# Patient Record
Sex: Female | Born: 1955 | Race: White | Hispanic: No | Smoking: Never smoker
Health system: Southern US, Community
[De-identification: ages and names within clinical notes are randomized; demographics above are authoritative.]

## PROBLEM LIST (undated history)

## (undated) DIAGNOSIS — T7840XA Allergy, unspecified, initial encounter: Secondary | ICD-10-CM

## (undated) DIAGNOSIS — J45909 Unspecified asthma, uncomplicated: Secondary | ICD-10-CM

## (undated) DIAGNOSIS — E785 Hyperlipidemia, unspecified: Secondary | ICD-10-CM

## (undated) DIAGNOSIS — M1990 Unspecified osteoarthritis, unspecified site: Secondary | ICD-10-CM

## (undated) HISTORY — PX: POLYPECTOMY: SHX149

## (undated) HISTORY — DX: Unspecified osteoarthritis, unspecified site: M19.90

## (undated) HISTORY — PX: BREAST ENHANCEMENT SURGERY: SHX7

## (undated) HISTORY — PX: COLONOSCOPY: SHX174

## (undated) HISTORY — PX: HAND SURGERY: SHX662

## (undated) HISTORY — DX: Allergy, unspecified, initial encounter: T78.40XA

## (undated) HISTORY — PX: DILATION AND CURETTAGE OF UTERUS: SHX78

## (undated) HISTORY — PX: RETAINED PLACENTA REMOVAL: SHX2336

## (undated) HISTORY — DX: Unspecified asthma, uncomplicated: J45.909

## (undated) HISTORY — DX: Hyperlipidemia, unspecified: E78.5

---

## 1999-10-03 ENCOUNTER — Other Ambulatory Visit: Admission: RE | Admit: 1999-10-03 | Discharge: 1999-10-03 | Payer: Self-pay | Admitting: Obstetrics & Gynecology

## 1999-10-03 ENCOUNTER — Encounter (INDEPENDENT_AMBULATORY_CARE_PROVIDER_SITE_OTHER): Payer: Self-pay

## 2001-01-22 ENCOUNTER — Encounter: Payer: Self-pay | Admitting: Obstetrics & Gynecology

## 2001-01-22 ENCOUNTER — Encounter: Admission: RE | Admit: 2001-01-22 | Discharge: 2001-01-22 | Payer: Self-pay | Admitting: Obstetrics & Gynecology

## 2001-02-10 ENCOUNTER — Other Ambulatory Visit: Admission: RE | Admit: 2001-02-10 | Discharge: 2001-02-10 | Payer: Self-pay | Admitting: *Deleted

## 2001-04-04 ENCOUNTER — Encounter (INDEPENDENT_AMBULATORY_CARE_PROVIDER_SITE_OTHER): Payer: Self-pay

## 2001-04-04 ENCOUNTER — Other Ambulatory Visit: Admission: RE | Admit: 2001-04-04 | Discharge: 2001-04-04 | Payer: Self-pay | Admitting: *Deleted

## 2002-01-29 ENCOUNTER — Encounter: Admission: RE | Admit: 2002-01-29 | Discharge: 2002-01-29 | Payer: Self-pay | Admitting: Family Medicine

## 2002-04-22 ENCOUNTER — Encounter: Payer: Self-pay | Admitting: Family Medicine

## 2002-04-22 ENCOUNTER — Encounter: Admission: RE | Admit: 2002-04-22 | Discharge: 2002-04-22 | Payer: Self-pay | Admitting: Family Medicine

## 2002-10-28 ENCOUNTER — Other Ambulatory Visit: Admission: RE | Admit: 2002-10-28 | Discharge: 2002-10-28 | Payer: Self-pay | Admitting: Obstetrics & Gynecology

## 2003-04-26 ENCOUNTER — Encounter: Payer: Self-pay | Admitting: Obstetrics & Gynecology

## 2003-04-26 ENCOUNTER — Encounter: Admission: RE | Admit: 2003-04-26 | Discharge: 2003-04-26 | Payer: Self-pay | Admitting: Obstetrics & Gynecology

## 2003-06-03 ENCOUNTER — Other Ambulatory Visit: Admission: RE | Admit: 2003-06-03 | Discharge: 2003-06-03 | Payer: Self-pay | Admitting: Obstetrics & Gynecology

## 2003-11-15 ENCOUNTER — Other Ambulatory Visit: Admission: RE | Admit: 2003-11-15 | Discharge: 2003-11-15 | Payer: Self-pay | Admitting: Obstetrics & Gynecology

## 2004-05-18 ENCOUNTER — Encounter: Admission: RE | Admit: 2004-05-18 | Discharge: 2004-05-18 | Payer: Self-pay | Admitting: Family Medicine

## 2007-05-15 ENCOUNTER — Encounter: Admission: RE | Admit: 2007-05-15 | Discharge: 2007-05-15 | Payer: Self-pay | Admitting: Family Medicine

## 2008-06-09 ENCOUNTER — Encounter: Admission: RE | Admit: 2008-06-09 | Discharge: 2008-06-09 | Payer: Self-pay | Admitting: Family Medicine

## 2009-06-13 ENCOUNTER — Encounter: Admission: RE | Admit: 2009-06-13 | Discharge: 2009-06-13 | Payer: Self-pay | Admitting: Family Medicine

## 2010-04-03 ENCOUNTER — Encounter: Payer: Self-pay | Admitting: Internal Medicine

## 2010-04-10 ENCOUNTER — Encounter: Payer: Self-pay | Admitting: Internal Medicine

## 2010-04-10 ENCOUNTER — Encounter: Admission: RE | Admit: 2010-04-10 | Discharge: 2010-04-10 | Payer: Self-pay | Admitting: Family Medicine

## 2010-05-12 ENCOUNTER — Encounter: Payer: Self-pay | Admitting: Internal Medicine

## 2010-05-18 ENCOUNTER — Ambulatory Visit: Payer: Self-pay | Admitting: Internal Medicine

## 2010-05-18 DIAGNOSIS — J45909 Unspecified asthma, uncomplicated: Secondary | ICD-10-CM | POA: Insufficient documentation

## 2010-05-18 DIAGNOSIS — R05 Cough: Secondary | ICD-10-CM

## 2010-05-18 DIAGNOSIS — Z77090 Contact with and (suspected) exposure to asbestos: Secondary | ICD-10-CM | POA: Insufficient documentation

## 2010-05-18 DIAGNOSIS — R059 Cough, unspecified: Secondary | ICD-10-CM | POA: Insufficient documentation

## 2010-06-14 ENCOUNTER — Ambulatory Visit: Payer: Self-pay | Admitting: Internal Medicine

## 2010-07-10 ENCOUNTER — Encounter: Admission: RE | Admit: 2010-07-10 | Discharge: 2010-07-10 | Payer: Self-pay | Admitting: Family Medicine

## 2010-07-18 ENCOUNTER — Ambulatory Visit: Payer: Self-pay | Admitting: Internal Medicine

## 2010-07-25 ENCOUNTER — Encounter: Admission: RE | Admit: 2010-07-25 | Discharge: 2010-07-25 | Payer: Self-pay | Admitting: Family Medicine

## 2010-11-14 NOTE — Assessment & Plan Note (Signed)
Summary: Pulmonary/ ext f/u ov with hfa 75% p coaching   Copy to:  Dr. Shaune Pollack Primary Provider/Referring Provider:  Dr. Shaune Pollack  CC:  Followup on cough.  Pt states that her cough is much improved since last seen.  She states that she still has occ cough with yellow sputum.  Ebony Jimenez  History of Present Illness: 55 yowf never smoker with prev h/o perennial rhinitis dating back to her 37's but never dx with asthma before 2011  May 18, 2010 cc abupt onset  cough and sob since May 2011 only ok on prednisone, worse off it so symbicort started by Dr Kevan Ny 7/29 and some better but still couging esp when lies down at night.  severity waxes and wanes with prednisone,  cough is minimally productive mucoid. rec Continue symbicort 80 2 puffs first thing  in am and 2 puffs again in pm about 12 hours later Start dexilant 60 mg Take  one 30-60 min before first meal of the day and pepcid and bedtime plus diet modifications per flyer Prednisone 10 4 each am x 2days, 2x2days, 1x2days and stop. Work on inhaler technique:  relax and blow all the way out then take a nice smooth deep breath back in, triggering the inhaler at same time you start breathing in   June 14, 2010 cc  her cough is some better since last seen- was much better while on prednisone but seemed to worsen after finished.  Cough is still prod with yellow/green sputum.  No new complaints today. still taking symbicort but technique not optimal. rec stop symbicort start dulera 200 2 puffs first thing  in am and 2 puffs again in pm about 12 hours later  Work on inhaler technique:  did so much better stopped everything but the flonase and dulera  July 18, 2010 Followup on cough.  Pt states that her cough is much improved since last seen.  She states that she still has occ cough with yellow sputum, occ in am but not noct disturbance.  overall  rates herself as 80% better since started dulera.  Pt denies any significant sore throat, dysphagia,  itching, sneezing,  nasal congestion or excess secretions,  fever, chills, sweats, unintended wt loss, pleuritic or exertional cp, hempoptysis, change in activity tolerance  orthopnea pnd or leg swelling   Current Medications (verified): 1)  Fluticasone Propionate 50 Mcg/act Susp (Fluticasone Propionate) .... 2 Sprays Each Nostril Once Daily 2)  Allegra Odt 30 Mg Tbdp (Fexofenadine Hcl) .Ebony Jimenez.. 1 Once Daily 3)  Mucinex Dm 30-600 Mg Xr12h-Tab (Dextromethorphan-Guaifenesin) .Ebony Jimenez.. 1-2 Every 12 Hours As Needed 4)  Pepcid Ac Maximum Strength 20 Mg Tabs (Famotidine) .... One At Bedtime 5)  Dulera 200-5 Mcg/act Aero (Mometasone Furo-Formoterol Fum) .... 2 Puffs First Thing  in Am and 2 Puffs Again in Pm About 12 Hours Later Not Taking Regularly On Wkends  Allergies (verified): No Known Drug Allergies  Past History:  Past Medical History: Asthma, clinical dx based on response to prednisone, dulera maintenance     - HFA 75% p coaching June 14, 2010 >  75% effective p coaching July 18, 2010       -Trial of dulera 200 June 14, 2010 >> 80% better July 18, 2010   Vital Signs:  Patient profile:   55 year old female Weight:      184 pounds O2 Sat:      97 % on Room air Temp:     98.2  degrees F oral Pulse rate:   72 / minute BP sitting:   120 / 82  (left arm)  Vitals Entered By: Vernie Murders (July 18, 2010 4:33 PM)  O2 Flow:  Room air  Physical Exam  Additional Exam:  wt 185 May 18, 2010 > 185 June 14, 2010 > 184 July 18, 2010  pleasant amb  wf  occ throat clearing but no pseudowheeze HEENT: nl dentition, turbinates, and orophanx. Nl external ear canals without cough reflex NECK :  without JVD/Nodes/TM/ nl carotid upstrokes bilaterally LUNGS: no acc muscle use,  trace insp and exp wheeze CV:  RRR  no s3 or murmur or increase in P2, no edema  ABD:  soft and nontender with nl excursion in the supine position. No bruits or organomegaly, bowel sounds nl MS:  warm without  deformities, calf tenderness, cyanosis or clubbing      Impression & Recommendations:  Problem # 1:  ASTHMA, UNSPECIFIED (ICD-493.90)  DDX of  difficult airways managment all start with A and  include Adherence, Ace Inhibitors, Acid Reflux, Active Sinus Disease, Alpha 1 Antitripsin deficiency, Anxiety masquerading as Airways dz,  ABPA,  allergy(esp in young), Aspiration (esp in elderly), Adverse effects of DPI,  Active smokers, plus one B  = Beta blocker use..    Adherence: I spent extra time with the patient today explaining optimal mdi  technique.  This improved from  50-75% p coaching so continue dulera ? acid reflux not as likely but continue h2  at hs  empirically plus diet  ? active sinus:  sinus ct next ov if not better ? allergy: profile next ov if not better  Each maintenance medication was reviewed in detail including most importantly the difference between maintenance prns and under what circumstances the prns are to be used.  In addition, these two groups (for which the patient should keep up with refills) were distinguished from a third group :  meds that are used only short term with the intent to complete a course of therapy and then not refill them.  The med list was then fully reconciled and reorganized to reflect this important distinction. See instructions for specific recommendations   Orders: Est. Patient Level IV (75643)  Medications Added to Medication List This Visit: 1)  Dulera 200-5 Mcg/act Aero (Mometasone furo-formoterol fum) .... 2 puffs first thing  in am and 2 puffs again in pm about 12 hours later not taking regularly on wkends 2)  Dulera 200-5 Mcg/act Aero (Mometasone furo-formoterol fum) .... 2 puffs first thing  in am and 2 puffs again in pm about 12 hours later 3)  Allegra Odt 30 Mg Tbdp (Fexofenadine hcl) .Ebony Jimenez.. 1 once daily as needed for itching sneezing runny nose 4)  Pepcid Ac Maximum Strength 20 Mg Tabs (Famotidine) .... One at bedtime if coughing at  all  Patient Instructions: 1)  Continue dulera 200 2 puffs first thing  in am and 2 puffs again in pm about 12 hours later  2)  Work on inhaler technique:  relax and blow all the way out then take a nice smooth deep breath back in, triggering the inhaler at same time you start breathing in hold a few seconds and back out thru nose  3)  Return to office in 3 months, sooner if needed  Prescriptions: DULERA 200-5 MCG/ACT AERO (MOMETASONE FURO-FORMOTEROL FUM) 2 puffs first thing  in am and 2 puffs again in pm about 12 hours later  #1  x 11   Entered and Authorized by:   Nyoka Cowden MD   Signed by:   Nyoka Cowden MD on 07/18/2010   Method used:   Electronically to        [facility]ail)       [address]       [place], [place]  [address]       Ph: [phone]       Fax: [phone]   RxID:   [id number]

## 2010-11-14 NOTE — Letter (Signed)
Summary: [facility]   Imported By: Lennie Odor 05/29/2010 14:25:54  _____________________________________________________________________  External Attachment:    Type:   Image     Comment:   External Document

## 2010-11-14 NOTE — Assessment & Plan Note (Signed)
Summary: Pulmonary consultation/ asthma/ cough   Visit Type:  Initial Consult Copy to:  Dr. Shaune Pollack Primary Provider/Referring Provider:  Dr. Shaune Pollack  CC:  Cough.  History of Present Illness: 18 yowf never smoker with prev h/o perennial rhinitis dating back to her 27's but never dx with asthma before 2011  May 18, 2010 cc abupt onset  cough and sob since May 2011 only ok on prednisone, worse off it so symbicort started by Dr Kevan Ny 7/29 and some better but still couging esp when lies down at night.  severity waxes and wanes with prednisone,  cough is minimally productive mucoid. Pt denies any significant sore throat, dysphagia, itching, sneezing,  nasal congestion or excess secretions,  fever, chills, sweats, unintended wt loss, pleuritic or exertional cp, hempoptysis, change in activity tolerance  orthopnea pnd or leg swelling .  Pt also denies any obvious fluctuation in symptoms with weather or environmental change or other alleviating or aggravating factors.       Current Medications (verified): 1)  Fluticasone Propionate 50 Mcg/act Susp (Fluticasone Propionate) .... 2 Sprays Each Nostril Once Daily 2)  Allegra Odt 30 Mg Tbdp (Fexofenadine Hcl) .Marland Kitchen.. 1 Once Daily 3)  Mucinex Dm 30-600 Mg Xr12h-Tab (Dextromethorphan-Guaifenesin) .Marland Kitchen.. 1 Two Times A Day 4)  Symbicort (? Strength) .... 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  The patient denies non-productive cough, coughing up blood, chest pain, irregular heartbeats, acid heartburn, indigestion, loss of appetite, weight change, abdominal  pain, difficulty swallowing, sore throat, tooth/dental problems, headaches, nasal congestion/difficulty breathing through nose, sneezing, itching, ear ache, anxiety, depression, hand/feet swelling, joint stiffness or pain, rash, change in color of mucus, and fever.    Vital Signs:  Patient profile:   55 year old female Height:      66 inches Weight:      185 pounds BMI:     29.97 O2 Sat:      98 % on Room air Temp:     98.3 degrees F oral Pulse rate:   77 / minute BP sitting:   110 / 82  (left arm)  Vitals Entered By: Vernie Murders (May 18, 2010 2:45 PM)  O2 Flow:  Room air  Physical Exam  Additional Exam:  wt 185 May 18, 2010 pleasant mod hoarse wf with pseudowheeze resolves with purse lip maneuver  HEENT: nl dentition, turbinates, and orophanx. Nl external ear canals without cough reflex NECK :  without JVD/Nodes/TM/ nl carotid upstrokes bilaterally LUNGS: no acc muscle use,  trace insp and exp wheeze CV:  RRR  no s3 or murmur or increase in P2, no edema  ABD:  soft and nontender with nl excursion in the supine position. No bruits or organomegaly, bowel sounds nl MS:  warm without deformities, calf tenderness, cyanosis or clubbing SKIN: warm and dry without lesions   NEURO:  alert, approp, no deficits     CXR  Procedure date:  04/10/2010  Findings:      reviewed:  wnl  Impression & Recommendations:  Problem # 1:  ASTHMA, UNSPECIFIED (ICD-493.90)  Strong risk  she is developing asthma given her longstanding perennial rhinitis, though could also have uacs fueled by reflux and triggering both upper and lower airway symptoms  I spent extra time with the patient today explaining optimal mdi  technique.  This improved from  25-50% but no better  See instructions for specific recommendations   Orders: Consultation Level V (16109)  Problem # 2:  COUGH (ICD-786.2) Of the three most common causes of chronic cough, only one (gerd)  can actually cause the other two  (asthma and pnds)  and perpetuate the cylce of cough inducing airway trauma, inflammation, heightened sensitivity to reflux which is prompted by the cough itself via a cyclical mechanism.  This may partially respond to steroids and look like asthma and post nasal drainage but never erradicated completely unless the cough and the secondary reflux are eliminated, preferably both at the same time.   reviewed diet, ppi and h2 hs dosing.  Medications Added to Medication List This Visit: 1)  Fluticasone Propionate 50 Mcg/act Susp (Fluticasone propionate) .... 2 sprays each nostril once daily 2)  Allegra Odt 30 Mg Tbdp (Fexofenadine hcl) .Marland Kitchen.. 1 once daily 3)  Mucinex Dm 30-600 Mg Xr12h-tab (Dextromethorphan-guaifenesin) .Marland Kitchen.. 1 two times a day 4)  Mucinex Dm 30-600 Mg Xr12h-tab (Dextromethorphan-guaifenesin) .Marland Kitchen.. 1-2 every 12 hours as needed 5)  Symbicort (? Strength)  .... 2 puffs two times a day 6)  Symbicort 80-4.5 Mcg/act Aero (Budesonide-formoterol fumarate) .... 2 puffs first thing  in am and 2 puffs again in pm about 12 hours later 7)  Dexilant 60 Mg Cpdr (Dexlansoprazole) .... Take  one 30-60 min before first meal of the day 8)  Pepcid Ac Maximum Strength 20 Mg Tabs (Famotidine) .... One at bedtime 9)  Prednisone 10 Mg Tabs (Prednisone) .... 4 each am x 2days, 2x2days, 1x2days and stop  Complete Medication List: 1)  Fluticasone Propionate 50 Mcg/act Susp (Fluticasone propionate) .... 2 sprays each nostril once daily 2)  Allegra Odt 30 Mg Tbdp (Fexofenadine hcl) .Marland Kitchen.. 1 once daily 3)  Mucinex Dm 30-600 Mg Xr12h-tab (Dextromethorphan-guaifenesin) .Marland Kitchen.. 1-2 every 12 hours as needed 4)  Symbicort 80-4.5 Mcg/act Aero (Budesonide-formoterol fumarate) .... 2 puffs first thing  in am and 2 puffs again in pm about 12 hours later 5)  Dexilant 60 Mg Cpdr (Dexlansoprazole) .... Take  one 30-60 min before first meal of the day 6)  Pepcid Ac Maximum Strength 20 Mg Tabs (Famotidine) .... One at bedtime 7)   Prednisone 10 Mg Tabs (Prednisone) .... 4 each am x 2days, 2x2days, 1x2days and stop  Patient Instructions: 1)  Continue symbicort 80 2 puffs first thing  in am and 2 puffs again in pm about 12 hours later 2)  Start dexilant 60 mg Take  one 30-60 min before first meal of the day and pepcid and bedtime plus diet modifications per flyer 3)  Prednisone 10 4 each am x 2days, 2x2days, 1x2days and stop. 4)  Work on inhaler technique:  relax and blow all the way out then take a nice smooth deep breath back in, triggering the inhaler at same time you start breathing in 5)  Please schedule a follow-up appointment in 2  weeks, sooner if needed

## 2010-11-14 NOTE — Letter (Signed)
Summary: [facility]   Imported By: Lennie Odor 05/29/2010 14:24:59  _____________________________________________________________________  External Attachment:    Type:   Image     Comment:   External Document

## 2010-11-14 NOTE — Letter (Signed)
Summary: Telephone Encounter / [facility]  Telephone Encounter / [facility]   Imported By: Lennie Odor 05/29/2010 13:58:27  _____________________________________________________________________  External Attachment:    Type:   Image     Comment:   External Document

## 2010-11-14 NOTE — Letter (Signed)
Summary: Telephone Encounter / [facility]  Telephone Encounter / [facility]   Imported By: Lennie Odor 05/29/2010 14:00:05  _____________________________________________________________________  External Attachment:    Type:   Image     Comment:   External Document

## 2010-11-14 NOTE — Assessment & Plan Note (Signed)
Summary: Pulmonary/ ext ov with hfa teaching @ 75% effective   Copy to:  Dr. Shaune Pollack Primary Amry Cathy/Referring Shaquon Gropp:  Dr. Shaune Pollack  CC:  2 wk followup.  Pt states that her cough is some better since last seen- was much better while on prednisone but seemed to worsen after finished.  Cough is still prod with yellow/green sputum.  No new complaints today.Marland Kitchen  History of Present Illness: 16 yowf never smoker with prev h/o perennial rhinitis dating back to her 82's but never dx with asthma before 2011  May 18, 2010 cc abupt onset  cough and sob since May 2011 only ok on prednisone, worse off it so symbicort started by Dr Kevan Ny 7/29 and some better but still couging esp when lies down at night.  severity waxes and wanes with prednisone,  cough is minimally productive mucoid. rec Continue symbicort 80 2 puffs first thing  in am and 2 puffs again in pm about 12 hours later Start dexilant 60 mg Take  one 30-60 min before first meal of the day and pepcid and bedtime plus diet modifications per flyer Prednisone 10 4 each am x 2days, 2x2days, 1x2days and stop. Work on inhaler technique:  relax and blow all the way out then take a nice smooth deep breath back in, triggering the inhaler at same time you start breathing in   June 14, 2010 cc  her cough is some better since last seen- was much better while on prednisone but seemed to worsen after finished.  Cough is still prod with yellow/green sputum.  No new complaints today. still taking symbicort but technique not optimal. Pt denies any significant sore throat, dysphagia, itching, sneezing,  nasal congestion or excess secretions,  fever, chills, sweats, unintended wt loss, pleuritic or exertional cp, hempoptysis, change in activity tolerance  orthopnea pnd or leg swelling. Pt also denies any obvious fluctuation in symptoms with weather or environmental change or other alleviating or aggravating factors.       Current Medications  (verified): 1)  Fluticasone Propionate 50 Mcg/act Susp (Fluticasone Propionate) .... 2 Sprays Each Nostril Once Daily 2)  Allegra Odt 30 Mg Tbdp (Fexofenadine Hcl) .Marland Kitchen.. 1 Once Daily 3)  Mucinex Dm 30-600 Mg Xr12h-Tab (Dextromethorphan-Guaifenesin) .Marland Kitchen.. 1-2 Every 12 Hours As Needed 4)  Symbicort 80-4.5 Mcg/act  Aero (Budesonide-Formoterol Fumarate) .... 2 Puffs First Thing  in Am and 2 Puffs Again in Pm About 12 Hours Later 5)  Pepcid Ac Maximum Strength 20 Mg Tabs (Famotidine) .... One At Bedtime  Allergies (verified): No Known Drug Allergies  Past History:  Past Medical History: Asthma     - HFA 75% p coaching June 14, 2010       -Trial of dulera 200 June 14, 2010 >>  Vital Signs:  Patient profile:   55 year old female Weight:      185.38 pounds O2 Sat:      93 % on Room air Temp:     97.9 degrees F oral Pulse rate:   71 / minute BP sitting:   128 / 78  (left arm)  Vitals Entered By: Vernie Murders (June 14, 2010 4:32 PM)  O2 Flow:  Room air  Physical Exam  Additional Exam:  wt 185 May 18, 2010 > 185 June 14, 2010  pleasant amb  wf  minimal hoarse/ pseudowheeze HEENT: nl dentition, turbinates, and orophanx. Nl external ear canals without cough reflex NECK :  without JVD/Nodes/TM/ nl carotid upstrokes bilaterally LUNGS: no  acc muscle use,  trace insp and exp wheeze CV:  RRR  no s3 or murmur or increase in P2, no edema  ABD:  soft and nontender with nl excursion in the supine position. No bruits or organomegaly, bowel sounds nl MS:  warm without deformities, calf tenderness, cyanosis or clubbing      Impression & Recommendations:  Problem # 1:  ASTHMA, UNSPECIFIED (ICD-493.90)   DDX of  difficult airways managment all start with A and  include Adherence, Ace Inhibitors, Acid Reflux, Active Sinus Disease, Alpha 1 Antitripsin deficiency, Anxiety masquerading as Airways dz,  ABPA,  allergy(esp in young), Aspiration (esp in elderly), Adverse effects of DPI,   Active smokers, plus one B  = Beta blocker use..    Adherence: I spent extra time with the patient today explaining optimal mdi  technique.  This improved from  50-75% so try dulera 200  ? acid reflux not as likely but continue h2 two times a day empirically plus diet  ? active sinus:  sinus ct next ov if not better ? allergy: profile next ov if not better  Medications Added to Medication List This Visit: 1)  Dulera 200-5 Mcg/act Aero (Mometasone furo-formoterol fum) .... 2 puffs first thing  in am and 2 puffs again in pm about 12 hours later  Other Orders: Est. Patient Level IV (46962)  Clinical Reports Reviewed:  CXR:  04/10/2010: CXR Results:  reviewed:  wnl   Patient Instructions: 1)  stop symbicort 2)  start dulera 200 2 puffs first thing  in am and 2 puffs again in pm about 12 hours later  3)  Work on inhaler technique:  relax and blow all the way out then take a nice smooth deep breath back in, triggering the inhaler at same time you start breathing in and hold about 5 secs then rinse mouth  4)  Please schedule a follow-up appointment in 4 weeks, sooner if needed

## 2010-12-07 ENCOUNTER — Ambulatory Visit (INDEPENDENT_AMBULATORY_CARE_PROVIDER_SITE_OTHER): Payer: BC Managed Care – PPO | Admitting: Internal Medicine

## 2010-12-07 ENCOUNTER — Encounter: Payer: Self-pay | Admitting: Internal Medicine

## 2010-12-07 DIAGNOSIS — R05 Cough: Secondary | ICD-10-CM

## 2010-12-07 DIAGNOSIS — R059 Cough, unspecified: Secondary | ICD-10-CM

## 2010-12-07 DIAGNOSIS — J310 Chronic rhinitis: Secondary | ICD-10-CM | POA: Insufficient documentation

## 2010-12-08 ENCOUNTER — Encounter: Payer: Self-pay | Admitting: Internal Medicine

## 2010-12-12 NOTE — Assessment & Plan Note (Signed)
Summary: Pulmonary/ chronic cough ? all ab   Copy to:  Dr. Shaune Pollack Primary Provider/Referring Provider:  Dr. Shaune Pollack  CC:  Cough- some better.  History of Present Illness: 71 yowf never smoker with prev h/o perennial rhinitis dating back to her 27's but never dx with asthma before 2011  May 18, 2010 cc abupt onset  cough and sob since May 2011 only ok on prednisone, worse off it so symbicort started by Dr Kevan Ny 7/29 and some better but still couging esp when lies down at night.  severity waxes and wanes with prednisone,  cough is minimally productive mucoid. rec Continue symbicort 80 2 puffs first thing  in am and 2 puffs again in pm about 12 hours later Start dexilant 60 mg Take  one 30-60 min before first meal of the day and pepcid and bedtime plus diet modifications per flyer Prednisone 10 4 each am x 2days, 2x2days, 1x2days and stop. Work on inhaler technique:  relax and blow all the way out then take a nice smooth deep breath back in, triggering the inhaler at same time you start breathing in   June 14, 2010 cc  her cough is some better since last seen- was much better while on prednisone but seemed to worsen after finished.  Cough is still prod with yellow/green sputum.  No new complaints today. still taking symbicort but technique not optimal. rec stop symbicort start dulera 200 2 puffs first thing  in am and 2 puffs again in pm about 12 hours later  Work on inhaler technique:  did so much better stopped everything but the flonase and dulera  July 18, 2010 Followup on cough.  Pt states that her cough is much improved since last seen.  She states that she still has occ cough with yellow sputum, occ in am but not noct disturbance.  overall  rates herself as 80% better since started dulera.  Continue dulera 200 2 puffs first thing  in am and 2 puffs again in pm about 12 hours later  Work on inhaler technique   See page 2  December 07, 2010 ov p stopped dulera Nov and  sick again in December and started back on dulera 200 2bid but still needed 5 days back on prednsione > back to baseline still coughing day > niight,  minimal yellowish mucus.  Pt denies any significant sore throat, dysphagia, itching, sneezing,  nasal congestion or excess secretions,  fever, chills, sweats, unintended wt loss, pleuritic or exertional cp, hempoptysis, change in activity tolerance  orthopnea pnd or leg swelling Pt also denies any obvious fluctuation in symptoms with weather or environmental change or other alleviating or aggravating factors.       Current Medications (verified): 1)  Dulera 200-5 Mcg/act Aero (Mometasone Furo-Formoterol Fum) .... 2 Puffs First Thing  in Am and 2 Puffs Again in Pm About 12 Hours Later-Stopped Taking 2)  Fluticasone Propionate 50 Mcg/act Susp (Fluticasone Propionate) .... 2 Sprays Each Nostril Once Daily 3)  Allegra Odt 30 Mg Tbdp (Fexofenadine Hcl) .Marland Kitchen.. 1 Once Daily As Needed For Itching Sneezing Runny Nose 4)  Mucinex Dm 30-600 Mg Xr12h-Tab (Dextromethorphan-Guaifenesin) .Marland Kitchen.. 1-2 Every 12 Hours As Needed 5)  Pepcid Ac Maximum Strength 20 Mg Tabs (Famotidine) .... One At Bedtime If Coughing At All  Allergies (verified): No Known Drug Allergies  Past History:  Past Medical History: Asthma, clinical dx based on response to prednisone, dulera maintenance     - HFA 75%  p coaching June 14, 2010 >  75% December 07, 2010       -Trial of dulera 200 June 14, 2010 >> 80% better July 18, 2010      - Spirometry December 07, 2010 >  no airflow obstruction (no dulera x > 12 h)     - Sinus ct ordered December 07, 2010   Vital Signs:  Patient profile:   55 year old female Weight:      174 pounds O2 Sat:      95 % on Room air Temp:     98.4 degrees F oral Pulse rate:   76 / minute BP sitting:   110 / 72  (left arm)  Vitals Entered By: Vernie Murders (December 07, 2010 4:21 PM)  O2 Flow:  Room air  Physical Exam  Additional Exam:  wt 185  May 18, 2010 > 185 June 14, 2010 > 184 July 18, 2010 > 174 December 07, 2010  pleasant amb  wf  occ throat  moderate pseudowheeze resolves with purse lip maneuver  HEENT: nl dentition, turbinates, and orophanx. Nl external ear canals without cough reflex NECK :  without JVD/Nodes/TM/ nl carotid upstrokes bilaterally LUNGS: no acc muscle use,  trace insp and exp wheeze CV:  RRR  no s3 or murmur or increase in P2, no edema  ABD:  soft and nontender with nl excursion in the supine position. No bruits or organomegaly, bowel sounds nl MS:  warm without deformities, calf tenderness, cyanosis or clubbing       Impression & Recommendations:  Problem # 1:  ASTHMA, UNSPECIFIED (ICD-493.90) mostly manifesting as cough at this point  DDX of  difficult airways managment all start with A and  include Adherence, Ace Inhibitors, Acid Reflux, Active Sinus Disease, Alpha 1 Antitripsin deficiency, Anxiety masquerading as Airways dz,  ABPA,  allergy(esp in young), Aspiration (esp in elderly), Adverse effects of DPI,  Active smokers, plus two Bs  = Bronchiectasis and Beta blocker use..and one C= CHF    Adherence not great.  I spent extra time with the patient today explaining optimal mdi  technique.  This improved from  50-75%  Active sinus dz:  rec sinus ct  ? acid reflux:  reviewed diet and for now just rx with pepcid if actively coughing to avoid cyclical cough since cough can cause secondary gerd which can cause cough  Problem # 2:  CHRONIC RHINITIS (ICD-472.0) This may be contributing to cough via  Classic Upper airway cough syndrome, so named because it's frequently impossible to sort out how much is  CR/sinusitis with freq throat clearing (which can be related to primary GERD)   vs  causing  secondary extra esophageal GERD from wide swings in gastric pressure that occur with throat clearing, promoting self use of mint and menthol lozenges that reduce the lower esophageal sphincter tone and  exacerbate the problem further These are the same pts who not infrequently have failed to tolerate ace inhibitors,  dry powder inhalers or biphosphonates or report having reflux symptoms that don't respond to standard doses of PPI  Rec:  continue flonase for now but check sinus ct and add trial of singulair x 4 weeks only  I had an extended discussion with the patient today lasting 15 to 20 minutes of a 25 minute visit on the following issues:  The standardized cough guidelines recently published in Chest are a 14 step process, not a single office visit,  and are  intended  to address this problem logically,  with an alogrithm dependent on response to each progressive step  to determine a specific diagnosis with  minimal addtional testing needed. Therefore if compliance is an issue this empiric standardized approach simply won't work.   Medications Added to Medication List This Visit: 1)  Dulera 200-5 Mcg/act Aero (Mometasone furo-formoterol fum) .... 2 puffs first thing  in am and 2 puffs again in pm about 12 hours later-stopped taking 2)  Singulair 10 Mg Tabs (Montelukast sodium) .... One by mouth daily  Other Orders: Est. Patient Level IV (95621)  Patient Instructions: 1)  Singulair 10 mg one each pm on a trial basis 2)  Please schedule a follow-up appointment in 4 weeks, sooner if needed  3)  If not satisfied call [facility] at [phone] and schedule a sinus ct limited before your visit. 4)  In meantime use  dulera 200mg  up to 2 every 12 hours as needed for cough Prescriptions: SINGULAIR 10 MG  TABS (MONTELUKAST SODIUM) One by mouth daily  #34 x 11   Entered and Authorized by:   Nyoka Cowden MD   Signed by:   Nyoka Cowden MD on 12/07/2010   Method used:   Electronically to        [facility]       [address]       [place], [place]  [address]       Ph: [phone]       Fax: [phone]   RxID:   [id number]   Appended Document:  Pulmonary/ chronic cough ? all ab copy to Dr Shaune Pollack

## 2011-05-30 ENCOUNTER — Ambulatory Visit: Payer: BC Managed Care – PPO | Admitting: Internal Medicine

## 2011-07-06 ENCOUNTER — Other Ambulatory Visit: Payer: Self-pay | Admitting: Family Medicine

## 2011-07-06 DIAGNOSIS — Z1231 Encounter for screening mammogram for malignant neoplasm of breast: Secondary | ICD-10-CM

## 2011-07-30 ENCOUNTER — Ambulatory Visit
Admission: RE | Admit: 2011-07-30 | Discharge: 2011-07-30 | Disposition: A | Discharge status: Home / Self Care | Payer: BC Managed Care – PPO | Source: Ambulatory Visit | Attending: Family Medicine | Admitting: Family Medicine

## 2011-07-30 DIAGNOSIS — Z1231 Encounter for screening mammogram for malignant neoplasm of breast: Secondary | ICD-10-CM

## 2011-09-08 ENCOUNTER — Other Ambulatory Visit: Payer: Self-pay | Admitting: Internal Medicine

## 2012-01-10 ENCOUNTER — Other Ambulatory Visit: Payer: Self-pay | Admitting: Family Medicine

## 2012-01-10 ENCOUNTER — Other Ambulatory Visit (HOSPITAL_COMMUNITY)
Admission: RE | Admit: 2012-01-10 | Discharge: 2012-01-10 | Disposition: A | Discharge status: Home / Self Care | Payer: BC Managed Care – PPO | Source: Ambulatory Visit | Attending: Family Medicine | Admitting: Family Medicine

## 2012-01-10 DIAGNOSIS — Z124 Encounter for screening for malignant neoplasm of cervix: Secondary | ICD-10-CM | POA: Insufficient documentation

## 2012-01-10 DIAGNOSIS — Z1159 Encounter for screening for other viral diseases: Secondary | ICD-10-CM | POA: Insufficient documentation

## 2012-07-01 ENCOUNTER — Other Ambulatory Visit: Payer: Self-pay | Admitting: Family Medicine

## 2012-07-01 DIAGNOSIS — Z1231 Encounter for screening mammogram for malignant neoplasm of breast: Secondary | ICD-10-CM

## 2012-08-04 ENCOUNTER — Ambulatory Visit: Payer: BC Managed Care – PPO

## 2012-08-22 ENCOUNTER — Ambulatory Visit
Admission: RE | Admit: 2012-08-22 | Discharge: 2012-08-22 | Disposition: A | Discharge status: Home / Self Care | Payer: BC Managed Care – PPO | Source: Ambulatory Visit | Attending: Family Medicine | Admitting: Family Medicine

## 2012-08-22 DIAGNOSIS — Z1231 Encounter for screening mammogram for malignant neoplasm of breast: Secondary | ICD-10-CM

## 2013-05-21 ENCOUNTER — Other Ambulatory Visit: Payer: Self-pay | Admitting: Gastroenterology

## 2013-09-01 ENCOUNTER — Other Ambulatory Visit: Payer: Self-pay

## 2013-09-01 DIAGNOSIS — Z1231 Encounter for screening mammogram for malignant neoplasm of breast: Secondary | ICD-10-CM

## 2013-10-02 ENCOUNTER — Ambulatory Visit
Admission: RE | Admit: 2013-10-02 | Discharge: 2013-10-02 | Disposition: A | Discharge status: Home / Self Care | Payer: BC Managed Care – PPO | Source: Ambulatory Visit

## 2013-10-02 ENCOUNTER — Other Ambulatory Visit: Payer: Self-pay | Admitting: Family Medicine

## 2013-10-02 DIAGNOSIS — N6310 Unspecified lump in the right breast, unspecified quadrant: Secondary | ICD-10-CM

## 2013-10-02 DIAGNOSIS — Z1231 Encounter for screening mammogram for malignant neoplasm of breast: Secondary | ICD-10-CM

## 2013-10-05 ENCOUNTER — Other Ambulatory Visit: Payer: Self-pay | Admitting: Family Medicine

## 2013-10-05 DIAGNOSIS — N6310 Unspecified lump in the right breast, unspecified quadrant: Secondary | ICD-10-CM

## 2013-10-12 ENCOUNTER — Ambulatory Visit
Admission: RE | Admit: 2013-10-12 | Discharge: 2013-10-12 | Disposition: A | Discharge status: Home / Self Care | Payer: BC Managed Care – PPO | Source: Ambulatory Visit | Attending: Family Medicine | Admitting: Family Medicine

## 2013-10-12 DIAGNOSIS — N6310 Unspecified lump in the right breast, unspecified quadrant: Secondary | ICD-10-CM

## 2015-06-09 ENCOUNTER — Ambulatory Visit: Payer: BC Managed Care – PPO | Admitting: Podiatry

## 2015-06-10 ENCOUNTER — Encounter: Payer: Self-pay | Admitting: Podiatry

## 2015-06-10 ENCOUNTER — Ambulatory Visit (INDEPENDENT_AMBULATORY_CARE_PROVIDER_SITE_OTHER): Payer: BC Managed Care – PPO

## 2015-06-10 ENCOUNTER — Ambulatory Visit (INDEPENDENT_AMBULATORY_CARE_PROVIDER_SITE_OTHER): Payer: BC Managed Care – PPO | Admitting: Podiatry

## 2015-06-10 VITALS — BP 107/62 | HR 73 | Resp 18

## 2015-06-10 DIAGNOSIS — R52 Pain, unspecified: Secondary | ICD-10-CM

## 2015-06-10 DIAGNOSIS — M722 Plantar fascial fibromatosis: Secondary | ICD-10-CM

## 2015-06-10 DIAGNOSIS — M2011 Hallux valgus (acquired), right foot: Secondary | ICD-10-CM

## 2015-06-10 MED ORDER — TRIAMCINOLONE ACETONIDE 10 MG/ML IJ SUSP
10.0000 mg | Freq: Once | INTRAMUSCULAR | Status: AC
Start: 2015-06-10 — End: 2015-06-10
  Administered 2015-06-10: 10 mg

## 2015-06-10 NOTE — Progress Notes (Signed)
   Subjective:    Patient ID: Ebony Jimenez, female    DOB: 04-02-1956, 59 y.o.   MRN: [id number]  HPI  59 year old female presents the office today with concerns of right heel pain as well as a bunion to the right foot. She states that the heel pain has been ongoing for several months is worse in the morning or after periods of inactivity. She said no prior treatment. She denies any numbness or tingling. Denies any recent injury or trauma. No swelling or redness. She also has a bunion which becomes painful in certain shoes however she has had no treatment. She states this been ongoing for several years and has been progressive. She denies any recent injury or trauma. No other complaints at this time.    Review of Systems  All other systems reviewed and are negative.      Objective:   Physical Exam AAO x3, NAD DP/PT pulses palpable bilaterally, CRT less than 3 seconds Protective sensation intact with Simms Weinstein monofilament, vibratory sensation intact, Achilles tendon reflex intact Tenderness to palpation overlying the plantar medial tubercle of the calcaneus to the right heel at the insertion of the plantar fascia. There is no pain along the course of plantar fascia within the arch of the foot. There is no pain with lateral compression of the calcaneus or pain the vibratory sensation. No pain on the posterior aspect of the calcaneus or along the course/insertion of the Achilles tendon. 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 There is no pain or crepitation with first MTPJ range of motion and range of motion is intact. No hypermobility present. There is slight irritation over one the medial aspect of the first metatarsal head from irritation. There is no overlying edema, erythema, increase in warmth. No other areas of tenderness palpation or pain with vibratory sensation to the foot/ankle. MMT 5/5, ROM WNL No open lesions or pre-ulcerative lesions are identified. No pain with  calf compression, swelling, warmth, erythema.     Assessment & Plan:  59 year old female right bunion, right heel pain likely plantar fasciitis -X-rays were obtained and reviewed with the patient.  -Treatment options discussed including all alternatives, risks, and complications  1. R heel pain; plantar fasciitis -Discussed likely etiology of her symptoms. -Patient elects to proceed with steroid injection into the right heel. Under sterile skin preparation, a total of 2.5cc of kenalog 10, 0.5% Marcaine plain, and 2% lidocaine plain were infiltrated into the symptomatic area without complication. A band-aid was applied. Patient tolerated the injection well without complication. Post-injection care with discussed with the patient. Discussed with the patient to ice the area over the next couple of days to help prevent a steroid flare.  -dispensedplantar fascial brace. -ice and stretching exercises daily. -Shoe gear modifications. -Discussed orthotics.  2. Right bunion -Discussed etiology -Discussed conservative and surgical treatment options. We'll continue conservative treatment. -Recommended orthotics -Padding offloading of the area. Discussed shoe gear. -If in the future symptoms continue likely surgical intervention.  -Follow-up in 3-4 weeks or sooner if any problems arise. In the meantime, encouraged to call the office with any questions, concerns, change in symptoms.   Celesta Gentile, DPM

## 2015-06-10 NOTE — Patient Instructions (Signed)
Plantar Fasciitis (Heel Spur Syndrome) with Rehab The plantar fascia is a fibrous, ligament-like, soft-tissue structure that spans the bottom of the foot. Plantar fasciitis is a condition that causes pain in the foot due to inflammation of the tissue. SYMPTOMS   Pain and tenderness on the underneath side of the foot.  Pain that worsens with standing or walking. CAUSES  Plantar fasciitis is caused by irritation and injury to the plantar fascia on the underneath side of the foot. Common mechanisms of injury include:  Direct trauma to bottom of the foot.  Damage to a small nerve that runs under the foot where the main fascia attaches to the heel bone.  Stress placed on the plantar fascia due to bone spurs. RISK INCREASES WITH:   Activities that place stress on the plantar fascia (running, jumping, pivoting, or cutting).  Poor strength and flexibility.  Improperly fitted shoes.  Tight calf muscles.  Flat feet.  Failure to warm-up properly before activity.  Obesity. PREVENTION  Warm up and stretch properly before activity.  Allow for adequate recovery between workouts.  Maintain physical fitness:  Strength, flexibility, and endurance.  Cardiovascular fitness.  Maintain a health body weight.  Avoid stress on the plantar fascia.  Wear properly fitted shoes, including arch supports for individuals who have flat feet. PROGNOSIS  If treated properly, then the symptoms of plantar fasciitis usually resolve without surgery. However, occasionally surgery is necessary. RELATED COMPLICATIONS   Recurrent symptoms that may result in a chronic condition.  Problems of the lower back that are caused by compensating for the injury, such as limping.  Pain or weakness of the foot during push-off following surgery.  Chronic inflammation, scarring, and partial or complete fascia tear, occurring more often from repeated injections. TREATMENT  Treatment initially involves the use of  ice and medication to help reduce pain and inflammation. The use of strengthening and stretching exercises may help reduce pain with activity, especially stretches of the Achilles tendon. These exercises may be performed at home or with a therapist. Your caregiver may recommend that you use heel cups of arch supports to help reduce stress on the plantar fascia. Occasionally, corticosteroid injections are given to reduce inflammation. If symptoms persist for greater than 6 months despite non-surgical (conservative), then surgery may be recommended.  MEDICATION   If pain medication is necessary, then nonsteroidal anti-inflammatory medications, such as aspirin and ibuprofen, or other minor pain relievers, such as acetaminophen, are often recommended.  Do not take pain medication within 7 days before surgery.  Prescription pain relievers may be given if deemed necessary by your caregiver. Use only as directed and only as much as you need.  Corticosteroid injections may be given by your caregiver. These injections should be reserved for the most serious cases, because they may only be given a certain number of times. HEAT AND COLD  Cold treatment (icing) relieves pain and reduces inflammation. Cold treatment should be applied for 10 to 15 minutes every 2 to 3 hours for inflammation and pain and immediately after any activity that aggravates your symptoms. Use ice packs or massage the area with a piece of ice (ice massage).  Heat treatment may be used prior to performing the stretching and strengthening activities prescribed by your caregiver, physical therapist, or athletic trainer. Use a heat pack or soak the injury in warm water. SEEK IMMEDIATE MEDICAL CARE IF:  Treatment seems to offer no benefit, or the condition worsens.  Any medications produce adverse side effects. EXERCISES RANGE   OF MOTION (ROM) AND STRETCHING EXERCISES - Plantar Fasciitis (Heel Spur Syndrome) These exercises may help you  when beginning to rehabilitate your injury. Your symptoms may resolve with or without further involvement from your physician, physical therapist or athletic trainer. While completing these exercises, remember:   Restoring tissue flexibility helps normal motion to return to the joints. This allows healthier, less painful movement and activity.  An effective stretch should be held for at least 30 seconds.  A stretch should never be painful. You should only feel a gentle lengthening or release in the stretched tissue. RANGE OF MOTION - Toe Extension, Flexion  Sit with your right / left leg crossed over your opposite knee.  Grasp your toes and gently pull them back toward the top of your foot. You should feel a stretch on the bottom of your toes and/or foot.  Hold this stretch for __________ seconds.  Now, gently pull your toes toward the bottom of your foot. You should feel a stretch on the top of your toes and or foot.  Hold this stretch for __________ seconds. Repeat __________ times. Complete this stretch __________ times per day.  RANGE OF MOTION - Ankle Dorsiflexion, Active Assisted  Remove shoes and sit on a chair that is preferably not on a carpeted surface.  Place right / left foot under knee. Extend your opposite leg for support.  Keeping your heel down, slide your right / left foot back toward the chair until you feel a stretch at your ankle or calf. If you do not feel a stretch, slide your bottom forward to the edge of the chair, while still keeping your heel down.  Hold this stretch for __________ seconds. Repeat __________ times. Complete this stretch __________ times per day.  STRETCH - Gastroc, Standing  Place hands on wall.  Extend right / left leg, keeping the front knee somewhat bent.  Slightly point your toes inward on your back foot.  Keeping your right / left heel on the floor and your knee straight, shift your weight toward the wall, not allowing your back to  arch.  You should feel a gentle stretch in the right / left calf. Hold this position for __________ seconds. Repeat __________ times. Complete this stretch __________ times per day. STRETCH - Soleus, Standing  Place hands on wall.  Extend right / left leg, keeping the other knee somewhat bent.  Slightly point your toes inward on your back foot.  Keep your right / left heel on the floor, bend your back knee, and slightly shift your weight over the back leg so that you feel a gentle stretch deep in your back calf.  Hold this position for __________ seconds. Repeat __________ times. Complete this stretch __________ times per day. STRETCH - Gastrocsoleus, Standing  Note: This exercise can place a lot of stress on your foot and ankle. Please complete this exercise only if specifically instructed by your caregiver.   Place the ball of your right / left foot on a step, keeping your other foot firmly on the same step.  Hold on to the wall or a rail for balance.  Slowly lift your other foot, allowing your body weight to press your heel down over the edge of the step.  You should feel a stretch in your right / left calf.  Hold this position for __________ seconds.  Repeat this exercise with a slight bend in your right / left knee. Repeat __________ times. Complete this stretch __________ times per day.    STRENGTHENING EXERCISES - Plantar Fasciitis (Heel Spur Syndrome)  These exercises may help you when beginning to rehabilitate your injury. They may resolve your symptoms with or without further involvement from your physician, physical therapist or athletic trainer. While completing these exercises, remember:   Muscles can gain both the endurance and the strength needed for everyday activities through controlled exercises.  Complete these exercises as instructed by your physician, physical therapist or athletic trainer. Progress the resistance and repetitions only as guided. STRENGTH -  Towel Curls  Sit in a chair positioned on a non-carpeted surface.  Place your foot on a towel, keeping your heel on the floor.  Pull the towel toward your heel by only curling your toes. Keep your heel on the floor.  If instructed by your physician, physical therapist or athletic trainer, add ____________________ at the end of the towel. Repeat __________ times. Complete this exercise __________ times per day. STRENGTH - Ankle Inversion  Secure one end of a rubber exercise band/tubing to a fixed object (table, pole). Loop the other end around your foot just before your toes.  Place your fists between your knees. This will focus your strengthening at your ankle.  Slowly, pull your big toe up and in, making sure the band/tubing is positioned to resist the entire motion.  Hold this position for __________ seconds.  Have your muscles resist the band/tubing as it slowly pulls your foot back to the starting position. Repeat __________ times. Complete this exercises __________ times per day.  Document Released: 10/01/2005 Document Revised: 12/24/2011 Document Reviewed: 01/13/2009 [employer] Patient Information 2015 [employer]. This information is not intended to replace advice given to you by your health care provider. Make sure you discuss any questions you have with your health care provider.  

## 2015-07-11 ENCOUNTER — Ambulatory Visit: Payer: BC Managed Care – PPO | Admitting: Podiatry

## 2016-02-08 ENCOUNTER — Encounter: Payer: Self-pay | Admitting: Internal Medicine

## 2016-02-08 ENCOUNTER — Ambulatory Visit (INDEPENDENT_AMBULATORY_CARE_PROVIDER_SITE_OTHER): Payer: BC Managed Care – PPO | Admitting: Internal Medicine

## 2016-02-08 VITALS — BP 106/70 | HR 85 | Ht 66.0 in | Wt 188.0 lb

## 2016-02-08 DIAGNOSIS — J45991 Cough variant asthma: Secondary | ICD-10-CM | POA: Diagnosis not present

## 2016-02-08 DIAGNOSIS — R059 Cough, unspecified: Secondary | ICD-10-CM

## 2016-02-08 DIAGNOSIS — R05 Cough: Secondary | ICD-10-CM

## 2016-02-08 MED ORDER — BUDESONIDE-FORMOTEROL FUMARATE 80-4.5 MCG/ACT IN AERO: INHALATION_SPRAY | RESPIRATORY_TRACT | Status: DC

## 2016-02-08 MED ORDER — BUDESONIDE-FORMOTEROL FUMARATE 160-4.5 MCG/ACT IN AERO: INHALATION_SPRAY | RESPIRATORY_TRACT | Status: DC

## 2016-02-08 NOTE — Progress Notes (Signed)
Subjective:     Patient ID: Ebony Jimenez, female   DOB: 1956/03/30,    MRN: [id number]  HPI  47 yowf never smoker with cough variant asthma last seen in [facility] 11/2010 with nl pfts rec dulera 200 but changed to Jimenez and since then back  under Ebony Jimenez direction plus shots concerned with freq flares req pred up to 10 days so self referred back to [facility] 02/08/2016    02/08/2016 1st Ebony Jimenez office visit/[facility]/  Ebony Jimenez  On Jimenez 500 bid /pred x 2 more days  Chief Complaint  Patient presents with  . [facility] Consult    Self referral. Pt c/o cough x 6 wks. She has improved recently on pred. Cough is occ prod with min clear sputum.   always better on prednisone lasts lasts several months or so then back to cough daily but doesn't wake her up typically  Allergies dog/cats/ feathers/ pollen  No rescue saba at baseline but up to every 4 hours when flares At baseline Not limited by breathing from desired activities    No obvious patterns day to day or daytime variability or assoc excess/ purulent sputum or mucus plugs or hemoptysis or cp or chest tightness, subjective wheeze or overt sinus or hb symptoms. No unusual exp hx or h/o childhood pna/ asthma or knowledge of premature birth.  Sleeping ok without nocturnal  or early am exacerbation  of respiratory  c/o's or need for noct saba. Also denies any obvious fluctuation of symptoms with weather or environmental changes or other aggravating or alleviating factors except as outlined above   Current Medications, Allergies, Complete Past Medical History, Past Surgical History, Family History, and Social History were reviewed in [employer] record.  ROS  The following are not active complaints unless bolded sore throat, dysphagia, dental problems, itching, sneezing,  nasal congestion or excess/ purulent secretions, ear ache,   fever, chills, sweats, unintended wt loss, classically pleuritic or  exertional cp,  orthopnea pnd or leg swelling, presyncope, palpitations, abdominal pain, anorexia, nausea, vomiting, diarrhea  or change in bowel or bladder habits, change in stools or urine, dysuria,hematuria,  rash, arthralgias, visual complaints, headache, numbness, weakness or ataxia or problems with walking or coordination,  change in mood/affect or memory.           Review of Systems     Objective:   Physical Exam    wt 185 May 18, 2010 > 185 June 14, 2010 > 184 July 18, 2010 > 174 December 07, 2010    pleasant amb wf  nad   Wt Readings from Last 3 Encounters:  02/08/16 188 lb (85.276 kg)  12/07/10 174 lb (78.926 kg)  07/18/10 184 lb (83.462 kg)    Vital signs reviewed  HEENT: nl dentition, turbinates, and orophanx. Nl external ear canals without cough reflex NECK : without JVD/Nodes/TM/ nl carotid upstrokes bilaterally LUNGS: no acc muscle use, trace end  exp wheeze CV: RRR no s3 or murmur or increase in P2, no edema  ABD: soft and nontender with nl excursion in the supine position. No bruits or organomegaly, bowel sounds nl MS: warm without deformities, calf tenderness, cyanosis or clubbing  Assessment:

## 2016-02-08 NOTE — Patient Instructions (Addendum)
Plan A = Automatic = Symbicort 80 Take 2 puffs first thing in am and then another 2 puffs about 12 hours later and singulair 10 mg daily   Work on inhaler technique:  relax and gently blow all the way out then take a nice smooth deep breath back in, triggering the inhaler at same time you start breathing in.  Hold for up to 5 seconds if you can. Blow out thru nose. Rinse and gargle with water when done     Plan B = Backup Only use your albuterol as a rescue medication to be used if you can't catch your breath by resting or doing a relaxed purse lip breathing pattern.  - The less you use it, the better it will work when you need it. - Ok to use the inhaler up to 2 puffs  every 4 hours if you must but call for appointment if use goes up over your usual need - Don't leave home without it !!  (think of it like the spare tire for your car)     If start coughing > Try prilosec otc 20mg   Take 30-60 min before first meal of the day and Pepcid ac (famotidine) 20 mg one @  bedtime until cough is completely gone for at least a week without the need for cough suppression  GERD (REFLUX)  is an extremely common cause of respiratory symptoms just like yours , many times with no obvious heartburn at all.    It can be treated with medication, but also with lifestyle changes including elevation of the head of your bed (ideally with 6 inch  bed blocks),  Smoking cessation, avoidance of late meals, excessive alcohol, and avoid fatty foods, chocolate, peppermint, colas, red wine, and acidic juices such as orange juice.  NO MINT OR MENTHOL PRODUCTS SO NO COUGH DROPS  USE SUGARLESS CANDY INSTEAD (Jolley ranchers or Stover's or Life Savers) or even ice chips will also do - the key is to swallow to prevent all throat clearing. NO OIL BASED VITAMINS - use powdered substitutes.  Please schedule a follow up visit in 3 months but call sooner if needed

## 2016-02-09 DIAGNOSIS — J45991 Cough variant asthma: Secondary | ICD-10-CM | POA: Insufficient documentation

## 2016-02-09 NOTE — Assessment & Plan Note (Addendum)
02/08/2016  After extensive coaching HFA effectiveness =    75% - Spirometry 02/08/2016  wnl including fef 25-75 s am advair    Historically difficult to control requiring pred up to 10 days at least twice yearly  DDX of  difficult airways management almost all start with A and  include Adherence, Ace Inhibitors, Acid Reflux, Active Sinus Disease, Alpha 1 Antitripsin deficiency, Anxiety masquerading as Airways dz,  ABPA,  Allergy(esp in young), Aspiration (esp in elderly), Adverse effects of meds,  Active smokers, A bunch of PE's (a small clot burden can't cause this syndrome unless there is already severe underlying pulm or vascular dz with poor reserve) plus two Bs  = Bronchiectasis and Beta blocker use..and one C= CHF   Adherence is always the initial "prime suspect" and is a multilayered concern that requires a "trust but verify" approach in every patient - starting with knowing how to use medications, especially inhalers, correctly, keeping up with refills and understanding the fundamental difference between maintenance and prns vs those medications only taken for a very short course and then stopped and not refilled.  - The proper method of use, as well as anticipated side effects, of a metered-dose inhaler are discussed and demonstrated to the patient. Improved effectiveness after extensive coaching during this visit to a level of approximately 75 % from a baseline of 50 % > try symbicort 80 2bid since her main symptom is cough, not sob  ? Adverse effects of dpi > try hfa symbicort in the 80 dose (sometimes less is more in this setting but if continues to flare change to the 160 strength  ? Allergy > defer rx to Dr Orvil Feil   -  not clear the singulair is preventing flares but if these changes eliminate the flares then would simplify rx and eliminate the singulair unless needed for rhinitis   ? Acid (or non-acid) GERD > always difficult to exclude as up to 75% of pts in some series report no assoc  GI/ Heartburn symptoms> rec with flare of cough max (24h)  acid suppression and in meantime maintain diet restrictions/ reviewed and instructions given in writing.   Total time devoted to counseling  = 35/44m review case with pt/ discussion of options/alternatives/ personally creating in presence of pt  then going over specific  Instructions directly with the pt including how to use all of the meds but in particular covering each new medication in detail (see avs)

## 2016-02-09 NOTE — Assessment & Plan Note (Signed)
ddx also includes Upper airway cough syndrome, so named because it's frequently impossible to sort out how much is  CR/sinusitis with freq throat clearing (which can be related to primary GERD)   vs  causing  secondary (" extra esophageal")  GERD from wide swings in gastric pressure that occur with throat clearing, often  promoting self use of mint and menthol lozenges that reduce the lower esophageal sphincter tone and exacerbate the problem further in a cyclical fashion.   These are the same pts (now being labeled as having "irritable larynx syndrome" by some cough centers) who not infrequently have a history of having failed to tolerate ace inhibitors,  dry powder inhalers(especially advair) or biphosphonates or report having atypical reflux symptoms that don't respond to standard doses of PPI , and are easily confused as having aecopd or asthma flares by even experienced allergists/ pulmonologists.   For now just rx with diet and use acid suppression prn flare. Explained the natural history of uri and why it's necessary in patients at risk to treat GERD aggressively - at least  short term -   to reduce risk of evolving cyclical cough initially  triggered by epithelial injury and a heightened sensitivty to the effects of any upper airway irritants,  most importantly acid - related - then perpetuated by epithelial injury related to the cough itself as the upper airway collapses on itself.  That is, the more sensitive the epithelium becomes once it is damaged by the virus, the more the ensuing irritability> the more the cough, the more the secondary reflux (especially in those prone to reflux) the more the irritation of the sensitive mucosa and so on in a  Classic cyclical pattern.

## 2016-02-09 NOTE — Progress Notes (Signed)
Quick Note:  Pt aware ______ 

## 2016-03-16 ENCOUNTER — Telehealth: Payer: Self-pay | Admitting: Internal Medicine

## 2016-03-16 MED ORDER — PREDNISONE 10 MG PO TABS: ORAL_TABLET | ORAL | Status: DC

## 2016-03-16 MED ORDER — BUDESONIDE-FORMOTEROL FUMARATE 80-4.5 MCG/ACT IN AERO: INHALATION_SPRAY | RESPIRATORY_TRACT | Status: DC

## 2016-03-16 NOTE — Telephone Encounter (Signed)
Prednisone 10 mg take  4 each am x 2 days,   2 each am x 2 days,  1 each am x 2 days and stop   But would try first the symbicort and also add whenever cough or wheeze:  Try prilosec otc 20mg   Take 30-60 min before first meal of the day and Pepcid ac (famotidine) 20 mg one @  bedtime

## 2016-03-16 NOTE — Telephone Encounter (Signed)
Spoke with pt, aware of results/recs.  Rx's sent to pharmacy.  Nothing further needed.

## 2016-03-16 NOTE — Telephone Encounter (Signed)
Spoke with pt, c/o pnd, nonprod cough, slight wheeze in L lung per ob/gyn yesterday in office.  Pt states that she often starts feeling like this and ends up needing prednisone to get back to breathing well again. Denies fever, chills, mucus production.   Pt states that she's started back on her Allegra, and taking Symbicort (pt needs refill on this also)  Pt uses [facility] [employer] on [address]    MW please advise on recs.  Thanks.     Patient Instructions       Plan A = Automatic = Symbicort 80 Take 2 puffs first thing in am and then another 2 puffs about 12 hours later and singulair 10 mg daily   Work on inhaler technique:  relax and gently blow all the way out then take a nice smooth deep breath back in, triggering the inhaler at same time you start breathing in.  Hold for up to 5 seconds if you can. Blow out thru nose. Rinse and gargle with water when done      Plan B = Backup Only use your albuterol as a rescue medication to be used if you can't catch your breath by resting or doing a relaxed purse lip breathing pattern.   - The less you use it, the better it will work when you need it. - Ok to use the inhaler up to 2 puffs  every 4 hours if you must but call for appointment if use goes up over your usual need - Don't leave home without it !!  (think of it like the spare tire for your car)     If start coughing > Try prilosec otc 20mg   Take 30-60 min before first meal of the day and Pepcid ac (famotidine) 20 mg one @  bedtime until cough is completely gone for at least a week without the need for cough suppression  GERD (REFLUX)  is an extremely common cause of respiratory symptoms just like yours , many times with no obvious heartburn at all.    It can be treated with medication, but also with lifestyle changes including elevation of the head of your bed (ideally with 6 inch  bed blocks),  Smoking cessation, avoidance of late meals, excessive alcohol, and avoid fatty foods, chocolate,  peppermint, colas, red wine, and acidic juices such as orange juice.   NO MINT OR MENTHOL PRODUCTS SO NO COUGH DROPS  USE SUGARLESS CANDY INSTEAD (Jolley ranchers or Stover's or Life Savers) or even ice chips will also do - the key is to swallow to prevent all throat clearing. NO OIL BASED VITAMINS - use powdered substitutes.  Please schedule a follow up visit in 3 months but call sooner if needed

## 2016-05-21 ENCOUNTER — Ambulatory Visit: Payer: BC Managed Care – PPO | Admitting: Internal Medicine

## 2017-05-29 ENCOUNTER — Ambulatory Visit: Payer: BC Managed Care – PPO | Admitting: Licensed Clinical Social Worker

## 2017-08-18 ENCOUNTER — Other Ambulatory Visit: Payer: Self-pay | Admitting: Internal Medicine

## 2017-12-17 ENCOUNTER — Encounter: Payer: Self-pay | Admitting: Internal Medicine

## 2018-02-13 ENCOUNTER — Other Ambulatory Visit: Payer: Self-pay

## 2018-02-13 ENCOUNTER — Ambulatory Visit (AMBULATORY_SURGERY_CENTER): Payer: Self-pay | Admitting: *Deleted

## 2018-02-13 VITALS — Ht 66.0 in | Wt 187.6 lb

## 2018-02-13 DIAGNOSIS — Z86010 Personal history of colonic polyps: Secondary | ICD-10-CM

## 2018-02-13 MED ORDER — NA SULFATE-K SULFATE-MG SULF 17.5-3.13-1.6 GM/177ML PO SOLN: 1.0000 | Freq: Once | ORAL | 0 refills | Status: AC

## 2018-02-13 NOTE — Progress Notes (Signed)
No egg or soy allergy known to patient  No issues with past sedation with any surgeries  or procedures, no intubation problems  No diet pills per patient No home 02 use per patient  No blood thinners per patient  Pt denies issues with constipation  No A fib or A flutter  EMMI video sent to pt's e mail - pt declined  $15 suprep coupon to pt today in [place]

## 2018-02-14 ENCOUNTER — Encounter: Payer: Self-pay | Admitting: Internal Medicine

## 2018-02-27 ENCOUNTER — Ambulatory Visit (AMBULATORY_SURGERY_CENTER): Payer: BC Managed Care – PPO | Admitting: Internal Medicine

## 2018-02-27 ENCOUNTER — Encounter: Payer: Self-pay | Admitting: Internal Medicine

## 2018-02-27 VITALS — BP 103/82 | HR 63 | Temp 98.2°F | Resp 13 | Ht 66.0 in | Wt 187.0 lb

## 2018-02-27 DIAGNOSIS — D123 Benign neoplasm of transverse colon: Secondary | ICD-10-CM

## 2018-02-27 DIAGNOSIS — Z86010 Personal history of colonic polyps: Secondary | ICD-10-CM | POA: Diagnosis present

## 2018-02-27 MED ORDER — SODIUM CHLORIDE 0.9 % IV SOLN: 500.0000 mL | Freq: Once | INTRAVENOUS | Status: DC

## 2018-02-27 NOTE — Op Note (Signed)
[name]: Ebony Jimenez Procedure Date: 02/27/2018 9:17 AM MRN: [id number] Endoscopist: Jerene Bears , MD Age: 62 Referring MD:  Date of Birth: 1956-06-04 Gender: Female Account #: [id number] Procedure:                Colonoscopy Indications:              Surveillance: Personal history of adenomatous                            polyps on last colonoscopy 5 years ago Medicines:                Monitored Anesthesia Care Procedure:                Pre-Anesthesia Assessment:                           - Prior to the procedure, a History and Physical                            was performed, and patient medications and                            allergies were reviewed. The patient's tolerance of                            previous anesthesia was also reviewed. The risks                            and benefits of the procedure and the sedation                            options and risks were discussed with the patient.                            All questions were answered, and informed consent                            was obtained. Prior Anticoagulants: The patient has                            taken no previous anticoagulant or antiplatelet                            agents. ASA Grade Assessment: II - A patient with                            mild systemic disease. After reviewing the risks                            and benefits, the patient was deemed in                            satisfactory condition to undergo the procedure.  After obtaining informed consent, the colonoscope                            was passed under direct vision. Throughout the                            procedure, the patient's blood pressure, pulse, and                            oxygen saturations were monitored continuously. The                            Colonoscope was introduced through the anus and                            advanced to the cecum, identified  by appendiceal                            orifice and ileocecal valve. The colonoscopy was                            performed without difficulty. The patient tolerated                            the procedure well. The quality of the bowel                            preparation was good. The ileocecal valve,                            appendiceal orifice, and rectum were photographed. Scope In: 9:25:28 AM Scope Out: 9:38:35 AM Scope Withdrawal Time: 0 hours 10 minutes 2 seconds  Total Procedure Duration: 0 hours 13 minutes 7 seconds  Findings:                 The digital rectal exam was normal.                           A 4 mm polyp was found in the transverse colon. The                            polyp was sessile. The polyp was removed with a                            cold snare. Resection and retrieval were complete.                           Multiple small and large-mouthed diverticula were                            found in the sigmoid colon and descending colon.                           Internal hemorrhoids were found during  retroflexion. The hemorrhoids were small. Complications:            No immediate complications. Estimated Blood Loss:     Estimated blood loss was minimal. Impression:               - One 4 mm polyp in the transverse colon, removed                            with a cold snare. Resected and retrieved.                           - Moderate diverticulosis in the sigmoid colon and                            in the descending colon.                           - Small internal hemorrhoids. Recommendation:           - Patient has a contact number available for                            emergencies. The signs and symptoms of potential                            delayed complications were discussed with the                            patient. Return to normal activities tomorrow.                            Written discharge instructions  were provided to the                            patient.                           - Resume previous diet.                           - Continue present medications.                           - Await pathology results.                           - Repeat colonoscopy in 5 years for surveillance. Jerene Bears, MD 02/27/2018 9:42:54 AM This report has been signed electronically.

## 2018-02-27 NOTE — Patient Instructions (Signed)
INFORMATION ON POLYPS, HEMORRHOIDS AND DIVERTICULOSIS GIVEN.  YOU HAD AN ENDOSCOPIC PROCEDURE TODAY AT [facility]:   Refer to the procedure report that was given to you for any specific questions about what was found during the examination.  If the procedure report does not answer your questions, please call your gastroenterologist to clarify.  If you requested that your care partner not be given the details of your procedure findings, then the procedure report has been included in a sealed envelope for you to review at your convenience later.  YOU SHOULD EXPECT: Some feelings of bloating in the abdomen. Passage of more gas than usual.  Walking can help get rid of the air that was put into your GI tract during the procedure and reduce the bloating. If you had a lower endoscopy (such as a colonoscopy or flexible sigmoidoscopy) you may notice spotting of blood in your stool or on the toilet paper. If you underwent a bowel prep for your procedure, you may not have a normal bowel movement for a few days.  Please Note:  You might notice some irritation and congestion in your nose or some drainage.  This is from the oxygen used during your procedure.  There is no need for concern and it should clear up in a day or so.  SYMPTOMS TO REPORT IMMEDIATELY:   Following lower endoscopy (colonoscopy or flexible sigmoidoscopy):  Excessive amounts of blood in the stool  Significant tenderness or worsening of abdominal pains  Swelling of the abdomen that is new, acute  Fever of 100F or higher   For urgent or emergent issues, a gastroenterologist can be reached at any hour by calling [phone].   DIET:  We do recommend a small meal at first, but then you may proceed to your regular diet.  Drink plenty of fluids but you should avoid alcoholic beverages for 24 hours.  ACTIVITY:  You should plan to take it easy for the rest of today and you should NOT DRIVE or use heavy machinery until  tomorrow (because of the sedation medicines used during the test).    FOLLOW UP: Our staff will call the number listed on your records the next business day following your procedure to check on you and address any questions or concerns that you may have regarding the information given to you following your procedure. If we do not reach you, we will leave a message.  However, if you are feeling well and you are not experiencing any problems, there is no need to return our call.  We will assume that you have returned to your regular daily activities without incident.  If any biopsies were taken you will be contacted by phone or by letter within the next 1-3 weeks.  Please call us at [phone] if you have not heard about the biopsies in 3 weeks.    SIGNATURES/CONFIDENTIALITY: You and/or your care partner have signed paperwork which will be entered into your electronic medical record.  These signatures attest to the fact that that the information above on your After Visit Summary has been reviewed and is understood.  Full responsibility of the confidentiality of this discharge information lies with you and/or your care-partner.

## 2018-02-27 NOTE — Progress Notes (Signed)
Pt's states no medical or surgical changes since previsit or office visit.Pt's states no medical or surgical changes since previsit or office visit. 

## 2018-02-27 NOTE — Progress Notes (Signed)
Called to room to assist during endoscopic procedure.  Patient ID and intended procedure confirmed with present staff. Received instructions for my participation in the procedure from the performing physician.  

## 2018-02-27 NOTE — Progress Notes (Signed)
Report given to PACU, vss 

## 2018-02-28 ENCOUNTER — Telehealth: Payer: Self-pay | Admitting: *Deleted

## 2018-02-28 NOTE — Telephone Encounter (Signed)
No answer. Number identifier. Mailbox full and unable to accept messages.

## 2018-02-28 NOTE — Telephone Encounter (Signed)
First call attempt, voicemail not accepting messages.

## 2018-03-05 ENCOUNTER — Encounter: Payer: Self-pay | Admitting: Internal Medicine

## 2018-12-19 ENCOUNTER — Other Ambulatory Visit: Payer: Self-pay | Admitting: Internal Medicine

## 2018-12-22 ENCOUNTER — Other Ambulatory Visit: Payer: Self-pay | Admitting: Internal Medicine

## 2019-02-14 ENCOUNTER — Emergency Department (HOSPITAL_COMMUNITY): Payer: BC Managed Care – PPO

## 2019-02-14 ENCOUNTER — Emergency Department (HOSPITAL_COMMUNITY)
Admission: EM | Admit: 2019-02-14 | Discharge: 2019-02-14 | Disposition: A | Discharge status: Home / Self Care | Payer: BC Managed Care – PPO | Attending: Emergency Medicine | Admitting: Emergency Medicine

## 2019-02-14 ENCOUNTER — Other Ambulatory Visit: Payer: Self-pay

## 2019-02-14 ENCOUNTER — Encounter (HOSPITAL_COMMUNITY): Payer: Self-pay

## 2019-02-14 DIAGNOSIS — R0600 Dyspnea, unspecified: Secondary | ICD-10-CM | POA: Diagnosis present

## 2019-02-14 DIAGNOSIS — J4541 Moderate persistent asthma with (acute) exacerbation: Secondary | ICD-10-CM | POA: Diagnosis not present

## 2019-02-14 DIAGNOSIS — Z79899 Other long term (current) drug therapy: Secondary | ICD-10-CM | POA: Insufficient documentation

## 2019-02-14 MED ORDER — PREDNISONE 20 MG PO TABS
60.0000 mg | ORAL_TABLET | Freq: Once | ORAL | Status: AC
  Administered 2019-02-14: 60 mg via ORAL
  Filled 2019-02-14: qty 3

## 2019-02-14 MED ORDER — ALBUTEROL SULFATE HFA 108 (90 BASE) MCG/ACT IN AERS
8.0000 | INHALATION_SPRAY | Freq: Once | RESPIRATORY_TRACT | Status: AC
  Administered 2019-02-14: 8 via RESPIRATORY_TRACT
  Filled 2019-02-14: qty 6.7

## 2019-02-14 MED ORDER — PREDNISONE 10 MG PO TABS: ORAL_TABLET | ORAL | 0 refills | Status: DC

## 2019-02-14 NOTE — ED Triage Notes (Signed)
Pt reports waking up this morning with an worsening asthma exacerbation, took 3 puffs of her albuterol inhaler about 1 hr ago with some relief but can feel her breathing getting worse. Expiratory wheezing noted in triage. 96% on room air at this time.

## 2019-02-14 NOTE — ED Provider Notes (Signed)
[facility] Provider Note   CSN: [id number] Arrival date & time: 02/14/19  1727    History   Chief Complaint Chief Complaint  Patient presents with  . Asthma    HPI Ebony Jimenez is a 63 y.o. female.     The history is provided by the patient. No language interpreter was used.  Asthma  This is a recurrent problem. The current episode started yesterday. The problem occurs constantly. The problem has been gradually worsening. Pertinent negatives include no shortness of breath. Nothing aggravates the symptoms. Nothing relieves the symptoms. She has tried nothing for the symptoms. The treatment provided no relief.  Pt complains of shortness of breath.  Pt has asthma.  Pt reports no relief with albuterol  Past Medical History:  Diagnosis Date  . Allergy   . Arthritis    foot   . Asthma   . Hyperlipidemia    diet controlled    Patient Active Problem List   Diagnosis Date Noted  . Cough variant asthma 02/09/2016  . CHRONIC RHINITIS 12/07/2010  . ASTHMA, UNSPECIFIED 05/18/2010  . COUGH 05/18/2010  . ASBESTOS EXPOSURE, HX OF 05/18/2010    Past Surgical History:  Procedure Laterality Date  . BREAST ENHANCEMENT SURGERY    . COLONOSCOPY     10 yrs ago   . DILATION AND CURETTAGE OF UTERUS    . HAND SURGERY Right    age 65  . POLYPECTOMY     pre cancerous polyps per pt at colon 10 yrs ago -   . RETAINED PLACENTA REMOVAL       OB History   No obstetric history on file.      Home Medications    Prior to Admission medications   Medication Sig Start Date End Date Taking? Authorizing Provider  budesonide-formoterol (SYMBICORT) 80-4.5 MCG/ACT inhaler TAKE 2 PUFFS FIRST THING IN THE MORNING AND THEN ANOTHER 2 PUFFS ABOUT 12 HOURS LATER. 08/19/17   Tanda Rockers, MD  EPINEPHrine 0.3 mg/0.3 mL IJ SOAJ injection AS DIRECTED AS DIRECTED INJECTION 30 DAYS 01/07/18   [provider]  fexofenadine (ALLEGRA) 180 MG tablet Take 180  mg by mouth daily.    [provider]  fluticasone (FLONASE) 50 MCG/ACT nasal spray USE TWO SPRAYS IN EACH NOSTRIL ONCE DAILY 04/29/15   [provider]  folic acid (FOLVITE) 1 MG tablet Take 1 mg by mouth daily. 12/04/17   [provider]  montelukast (SINGULAIR) 10 MG tablet  05/30/15   [provider]  OVER THE COUNTER MEDICATION daily. [employer] and exercise psychologist, Historical, MD  potassium citrate (UROCIT-K) 10 MEQ (1080 MG) SR tablet Take 10 mEq by mouth daily.    [provider]  predniSONE (DELTASONE) 10 MG tablet 5,4,3,2,1 taper 02/14/19   Fransico Meadow, PA-C  Vitamin D, Ergocalciferol, (DRISDOL) 50000 units CAPS capsule 1 CAPSULE ONCE A WEEK ORALLY 01/24/18   [provider]    Family History Family History  Problem Relation Age of Onset  . Asthma Brother   . Asthma Mother   . Heart disease Mother   . Asthma Sister   . Cancer Father        mouth- never smoked  . Colon cancer Neg Hx   . Colon polyps Neg Hx   . Esophageal cancer Neg Hx   . Rectal cancer Neg Hx   . Stomach cancer Neg Hx     Social History Social History  Tobacco Use  . Smoking status: Never Smoker  . Smokeless tobacco: Never Used  Substance Use Topics  . Alcohol use: Yes    Alcohol/week: 0.0 standard drinks    Comment: socially  . Drug use: No     Allergies   Patient has no known allergies.   Review of Systems Review of Systems  Respiratory: Negative for shortness of breath.   All other systems reviewed and are negative.    Physical Exam Updated Vital Signs BP 113/85 (BP Location: Right Arm)   Pulse (!) 107   Temp (!) 97.5 F (36.4 C) (Oral)   Resp 14   Ht 5\' 6"  (1.676 m)   Wt 88.5 kg   LMP 06/30/2011   SpO2 95%   BMI 31.47 kg/m   Physical Exam Vitals signs and nursing note reviewed.  Constitutional:      Appearance: She is well-developed.  HENT:     Head: Normocephalic.     Right Ear: Tympanic membrane normal.     Left  Ear: Tympanic membrane normal.     Nose: Nose normal.  Eyes:     Pupils: Pupils are equal, round, and reactive to light.  Neck:     Musculoskeletal: Normal range of motion.  Cardiovascular:     Rate and Rhythm: Normal rate.  Pulmonary:     Breath sounds: Wheezing present.  Abdominal:     General: There is no distension.  Musculoskeletal: Normal range of motion.  Skin:    General: Skin is warm.  Neurological:     General: No focal deficit present.     Mental Status: She is alert and oriented to person, place, and time.  Psychiatric:        Mood and Affect: Mood normal.      ED Treatments / Results  Labs (all labs ordered are listed, but only abnormal results are displayed) Labs Reviewed - No data to display  EKG None  Radiology Dg Chest Port 1 View  Result Date: 02/14/2019 CLINICAL DATA:  Cough EXAM: PORTABLE CHEST 1 VIEW COMPARISON:  04/10/2010 FINDINGS: Heart and mediastinal contours are within normal limits. No focal opacities or effusions. No acute bony abnormality. IMPRESSION: No active disease. Electronically Signed   By: Rolm Baptise M.D.   On: 02/14/2019 20:39    Procedures Procedures (including critical care time)  Medications Ordered in ED Medications  albuterol (VENTOLIN HFA) 108 (90 Base) MCG/ACT inhaler 8 puff (8 puffs Inhalation Given 02/14/19 1943)  predniSONE (DELTASONE) tablet 60 mg (60 mg Oral Given 02/14/19 2115)     Initial Impression / Assessment and Plan / ED Course  I have reviewed the triage vital signs and the nursing notes.  Pertinent labs & imaging results that were available during my care of the patient were reviewed by me and considered in my medical decision making (see chart for details).        MDM  Chest xray is normal.  Pt given 8 puffs of albuterol. Pt given prednisone 60 mg po. Pt counseled on using albuterol   Final Clinical Impressions(s) / ED Diagnoses   Final diagnoses:  Moderate persistent asthma with exacerbation     ED Discharge Orders         Ordered    predniSONE (DELTASONE) 10 MG tablet     02/14/19 2133        An After Visit Summary was printed and given to the patient.   Fransico Meadow, [place] 02/14/19 2221  Carmin Muskrat, MD 02/15/19 [phone]

## 2019-02-14 NOTE — Discharge Instructions (Signed)
Return if any problems.  See your Physician for recheck in 2-3 days °

## 2019-04-02 ENCOUNTER — Encounter: Payer: Self-pay | Admitting: Podiatry

## 2019-04-02 ENCOUNTER — Ambulatory Visit: Payer: BC Managed Care – PPO | Admitting: Podiatry

## 2019-04-02 ENCOUNTER — Other Ambulatory Visit: Payer: Self-pay

## 2019-04-02 VITALS — BP 122/72 | HR 95 | Temp 98.6°F

## 2019-04-02 DIAGNOSIS — B351 Tinea unguium: Secondary | ICD-10-CM

## 2019-04-02 DIAGNOSIS — L600 Ingrowing nail: Secondary | ICD-10-CM | POA: Diagnosis not present

## 2019-04-02 MED ORDER — TERBINAFINE HCL 250 MG PO TABS: ORAL_TABLET | ORAL | 0 refills | Status: DC

## 2019-04-02 MED ORDER — NEOMYCIN-POLYMYXIN-HC 3.5-10000-1 OT SOLN: OTIC | 1 refills | Status: DC

## 2019-04-02 NOTE — Patient Instructions (Signed)

## 2019-04-02 NOTE — Progress Notes (Signed)
Subjective:   Patient ID: Ebony Jimenez, female   DOB: 63 y.o.   MRN: [id number]   HPI Patient presents stating I have a painful fifth nail of my right foot and my big toenail is discolored and I dropped a suitcase on it.  States that all other nails are perfect shape and the right fifth nail is continuing to cause her problems and she would like it removed.  Patient does not smoke likes to be active   Review of Systems  All other systems reviewed and are negative.       Objective:  Physical Exam Vitals signs and nursing note reviewed.  Constitutional:      Appearance: She is well-developed.  Pulmonary:     Effort: Pulmonary effort is normal.  Musculoskeletal: Normal range of motion.  Skin:    General: Skin is warm.  Neurological:     Mental Status: She is alert.     Neurovascular status found to be intact muscle strength adequate range of motion within normal limit with patient found to have a very dystrophic thickened abnormal fifth nail right that is growing in a abnormal fashion.  The right hallux nail is yellow discolored but is growing in a relatively good fashion.  Patient has good digital perfusion well oriented x3     Assessment:  Severely damaged fifth nail right with chronic thickness and pain along with discolored right hallux nail     Plan:  H&P conditions reviewed and recommended removal of the nail explained procedure risk and allowing her to sign a consent form going over risk.  Today I infiltrated the right fifth digit 60 mg like Marcaine mixture I removed the nail sterile instrumentation utilized exposed root and applied phenol for applications 30 seconds followed by alcohol by sterile dressing.  Gave instructions on soaks and to wear dressing 24 hours but to take it off earlier if needed and also placed on drops with instructions on usage.  For the right hallux I did write her prescription for a 28-day pulse pack along with laser therapy x4 months and also topical  medication.  Reappoint to recheck

## 2019-04-07 ENCOUNTER — Other Ambulatory Visit: Payer: Self-pay

## 2019-04-07 ENCOUNTER — Ambulatory Visit: Payer: BC Managed Care – PPO

## 2019-04-07 DIAGNOSIS — L600 Ingrowing nail: Secondary | ICD-10-CM

## 2019-04-07 DIAGNOSIS — B351 Tinea unguium: Secondary | ICD-10-CM

## 2019-04-07 NOTE — Patient Instructions (Signed)

## 2019-04-21 NOTE — Progress Notes (Signed)
Pt presents with mycotic infection of nails 1-5 bilateral.  All other systems are negative  Laser therapy administered to affected nails and tolerated well. All safety precautions were in place.  1st treatment.  Follow up in 4 weeks     

## 2019-04-30 ENCOUNTER — Other Ambulatory Visit: Payer: Self-pay | Admitting: Podiatry

## 2019-05-05 ENCOUNTER — Ambulatory Visit: Payer: Self-pay

## 2019-05-05 ENCOUNTER — Other Ambulatory Visit: Payer: Self-pay

## 2019-05-05 DIAGNOSIS — L600 Ingrowing nail: Secondary | ICD-10-CM

## 2019-05-05 DIAGNOSIS — B351 Tinea unguium: Secondary | ICD-10-CM

## 2019-05-28 NOTE — Progress Notes (Signed)
Pt presents with mycotic infection of nails 1-5 bilateral.  All other systems are negative  Laser therapy administered to affected nails and tolerated well. All safety precautions were in place.  2nd treatment.  Follow up in 4 weeks     

## 2019-06-08 ENCOUNTER — Other Ambulatory Visit: Payer: Self-pay

## 2019-06-08 ENCOUNTER — Ambulatory Visit: Payer: Self-pay | Admitting: Podiatry

## 2019-06-08 DIAGNOSIS — L600 Ingrowing nail: Secondary | ICD-10-CM

## 2019-06-08 DIAGNOSIS — B351 Tinea unguium: Secondary | ICD-10-CM

## 2019-07-06 ENCOUNTER — Ambulatory Visit: Payer: Self-pay

## 2019-07-06 ENCOUNTER — Other Ambulatory Visit: Payer: Self-pay

## 2019-07-06 DIAGNOSIS — B351 Tinea unguium: Secondary | ICD-10-CM

## 2019-07-10 NOTE — Progress Notes (Signed)
Pt presents with mycotic infection of nails 1-5 bilateral.  All other systems are negative  Laser therapy administered to affected nails and tolerated well. All safety precautions were in place.  3rd treatment.  Follow up in 4 weeks     

## 2019-07-21 NOTE — Progress Notes (Signed)
Pt presents with mycotic infection of nails 1-5 bilateral  All other systems are negative  Laser therapy administered to affected nails and tolerated well. All safety precautions were in place. 4th treatment.  Follow up in 4 weeks     

## 2019-08-11 ENCOUNTER — Other Ambulatory Visit: Payer: BC Managed Care – PPO

## 2019-08-14 ENCOUNTER — Other Ambulatory Visit: Payer: BC Managed Care – PPO

## 2019-08-14 ENCOUNTER — Other Ambulatory Visit: Payer: Self-pay

## 2019-08-22 ENCOUNTER — Other Ambulatory Visit: Payer: BC Managed Care – PPO

## 2019-09-25 ENCOUNTER — Other Ambulatory Visit: Payer: BC Managed Care – PPO

## 2019-12-12 ENCOUNTER — Ambulatory Visit: Payer: BC Managed Care – PPO | Attending: Internal Medicine

## 2019-12-12 DIAGNOSIS — Z23 Encounter for immunization: Secondary | ICD-10-CM

## 2019-12-12 NOTE — Progress Notes (Signed)
   Covid-19 Vaccination Clinic  Name:  Ebony Jimenez    MRN: [id number] DOB: 1955/11/22  12/12/2019  Ebony Jimenez was observed post Covid-19 immunization for 15 minutes without incidence. She was provided with Vaccine Information Sheet and instruction to access the V-Safe system.   Ebony Jimenez was instructed to call 911 with any severe reactions post vaccine: Marland Kitchen Difficulty breathing  . Swelling of your face and throat  . A fast heartbeat  . A bad rash all over your body  . Dizziness and weakness    Immunizations Administered    Name Date Dose VIS Date Route   Pfizer COVID-19 Vaccine 12/12/2019 10:44 AM 0.3 mL 09/25/2019 Intramuscular   Manufacturer: [place]   Lot: [id number]   [place]: [id number]

## 2020-01-02 ENCOUNTER — Ambulatory Visit: Payer: BC Managed Care – PPO | Attending: Internal Medicine

## 2020-01-02 DIAGNOSIS — Z23 Encounter for immunization: Secondary | ICD-10-CM

## 2020-01-02 NOTE — Progress Notes (Signed)
   Covid-19 Vaccination Clinic  Name:  Ebony Jimenez    MRN: [id number] DOB: 03/25/1956  01/02/2020  Ebony Jimenez was observed post Covid-19 immunization for 15 minutes without incident. She was provided with Vaccine Information Sheet and instruction to access the V-Safe system.   Ebony Jimenez was instructed to call 911 with any severe reactions post vaccine: Marland Kitchen Difficulty breathing  . Swelling of face and throat  . A fast heartbeat  . A bad rash all over body  . Dizziness and weakness   Immunizations Administered    Name Date Dose VIS Date Route   Pfizer COVID-19 Vaccine 01/02/2020  2:27 PM 0.3 mL 09/25/2019 Intramuscular   Manufacturer: [place]   Lot: [id number]   [place]: [id number]

## 2020-01-06 ENCOUNTER — Ambulatory Visit: Payer: Self-pay

## 2020-01-14 ENCOUNTER — Emergency Department (HOSPITAL_COMMUNITY)
Admission: EM | Admit: 2020-01-14 | Discharge: 2020-01-14 | Disposition: A | Discharge status: Home / Self Care | Payer: BC Managed Care – PPO | Attending: Emergency Medicine | Admitting: Emergency Medicine

## 2020-01-14 ENCOUNTER — Encounter (HOSPITAL_COMMUNITY): Payer: Self-pay

## 2020-01-14 ENCOUNTER — Other Ambulatory Visit: Payer: Self-pay

## 2020-01-14 ENCOUNTER — Emergency Department (HOSPITAL_COMMUNITY): Payer: BC Managed Care – PPO

## 2020-01-14 DIAGNOSIS — R7989 Other specified abnormal findings of blood chemistry: Secondary | ICD-10-CM | POA: Diagnosis not present

## 2020-01-14 DIAGNOSIS — Z79818 Long term (current) use of other agents affecting estrogen receptors and estrogen levels: Secondary | ICD-10-CM | POA: Insufficient documentation

## 2020-01-14 DIAGNOSIS — J45909 Unspecified asthma, uncomplicated: Secondary | ICD-10-CM | POA: Insufficient documentation

## 2020-01-14 DIAGNOSIS — R519 Headache, unspecified: Secondary | ICD-10-CM | POA: Insufficient documentation

## 2020-01-14 DIAGNOSIS — R55 Syncope and collapse: Secondary | ICD-10-CM | POA: Diagnosis present

## 2020-01-14 DIAGNOSIS — Z77090 Contact with and (suspected) exposure to asbestos: Secondary | ICD-10-CM | POA: Insufficient documentation

## 2020-01-14 LAB — CBC
HCT: 42.4 % (ref 36.0–46.0)
Hemoglobin: 13.2 g/dL (ref 12.0–15.0)
MCH: 28.4 pg (ref 26.0–34.0)
MCHC: 31.1 g/dL (ref 30.0–36.0)
MCV: 91.4 fL (ref 80.0–100.0)
Platelets: 239 10*3/uL (ref 150–400)
RBC: 4.64 MIL/uL (ref 3.87–5.11)
RDW: 15.6 % — ABNORMAL HIGH (ref 11.5–15.5)
WBC: 7.9 10*3/uL (ref 4.0–10.5)
nRBC: 0 % (ref 0.0–0.2)

## 2020-01-14 LAB — BASIC METABOLIC PANEL
Anion gap: 8 (ref 5–15)
BUN: 18 mg/dL (ref 8–23)
CO2: 26 mmol/L (ref 22–32)
Calcium: 9.5 mg/dL (ref 8.9–10.3)
Chloride: 106 mmol/L (ref 98–111)
Creatinine, Ser: 0.95 mg/dL (ref 0.44–1.00)
GFR calc Af Amer: >60 mL/min (ref >60)
GFR calc non Af Amer: >60 mL/min (ref >60)
Glucose, Bld: 82 mg/dL (ref 70–99)
Potassium: 4.2 mmol/L (ref 3.5–5.1)
Sodium: 140 mmol/L (ref 135–145)

## 2020-01-14 LAB — URINALYSIS, ROUTINE W REFLEX MICROSCOPIC
Bilirubin Urine: NEGATIVE
Glucose, UA: NEGATIVE mg/dL
Hgb urine dipstick: NEGATIVE
Ketones, ur: NEGATIVE mg/dL
Leukocytes,Ua: NEGATIVE
Nitrite: NEGATIVE
Protein, ur: NEGATIVE mg/dL
Specific Gravity, Urine: 1.01 (ref 1.005–1.030)
pH: 6 (ref 5.0–8.0)

## 2020-01-14 LAB — D-DIMER, QUANTITATIVE: D-Dimer, Quant: 1.62 ug/mL-FEU — ABNORMAL HIGH (ref 0.00–0.50)

## 2020-01-14 LAB — CBG MONITORING, ED: Glucose-Capillary: 73 mg/dL (ref 70–99)

## 2020-01-14 MED ORDER — IOHEXOL 350 MG/ML SOLN
100.0000 mL | Freq: Once | INTRAVENOUS | Status: AC | PRN
  Administered 2020-01-14: 100 mL via INTRAVENOUS

## 2020-01-14 MED ORDER — SODIUM CHLORIDE (PF) 0.9 % IJ SOLN
INTRAMUSCULAR | Status: AC
  Filled 2020-01-14: qty 50

## 2020-01-14 NOTE — Discharge Instructions (Addendum)
Please follow up with your family doctor for further evaluation of why you passed out.

## 2020-01-14 NOTE — ED Triage Notes (Addendum)
Pt BIBA from home. Pt called out for witness syncopal episode yesterday. Pt states since then, she's been having tremors.  Pt also adds that she may have had a similar incidence earlier this week.  No tremors noted at this time  150sys

## 2020-01-14 NOTE — ED Notes (Signed)
Pt stated she had no signs of dizziness or SOB during orthostatic vital signs. States she felt completely fine while obtaining the vitals.

## 2020-01-14 NOTE — ED Provider Notes (Signed)
[facility] DEPT Provider Note   CSN: [id number] Arrival date & time: 01/14/20  1809     History Chief Complaint  Patient presents with  . Loss of Consciousness    2 days prior    Ebony Jimenez is a 64 y.o. female.  The history is provided by the patient and medical records. No language interpreter was used.  Loss of Consciousness  Ebony Jimenez is a 64 y.o. female who presents to the Emergency Department complaining of syncope.  She presents to the ED for evaluation following a syncopal event.  This occurred last night.  After dinner yesterday she sat down on the couch and she slumped over.  She felt like her eyelids were fluttering.  Her husband states that she was unresponsive for about four minutes.  She is unsure if she recalls the event.  She did drink some alcohol yesterday but describes it as not that much.   He wanted to call EMS and she declined last night.  He helped her to bed and she went to bed last night.  Today she went to go get evaluated at urgent care and began to feel jittery and was referred to the ED for further eval.    She had a mild headache earlier today (none yesterday), now resolved.    Denies fever, cough, sob, abdominal pain, N/V/D, leg swelling/pain.    They recently returned home from a trip to the beach.    Past Medical History:  Diagnosis Date  . Allergy   . Arthritis    foot   . Asthma   . Hyperlipidemia    diet controlled    Patient Active Problem List   Diagnosis Date Noted  . Cough variant asthma 02/09/2016  . CHRONIC RHINITIS 12/07/2010  . ASTHMA, UNSPECIFIED 05/18/2010  . COUGH 05/18/2010  . ASBESTOS EXPOSURE, HX OF 05/18/2010    Past Surgical History:  Procedure Laterality Date  . BREAST ENHANCEMENT SURGERY    . COLONOSCOPY     10 yrs ago   . DILATION AND CURETTAGE OF UTERUS    . HAND SURGERY Right    age 50  . POLYPECTOMY     pre cancerous polyps per pt at colon 10 yrs ago -   . RETAINED  PLACENTA REMOVAL       OB History   No obstetric history on file.     Family History  Problem Relation Age of Onset  . Asthma Brother   . Asthma Mother   . Heart disease Mother   . Asthma Sister   . Cancer Father        mouth- never smoked  . Colon cancer Neg Hx   . Colon polyps Neg Hx   . Esophageal cancer Neg Hx   . Rectal cancer Neg Hx   . Stomach cancer Neg Hx     Social History   Tobacco Use  . Smoking status: Never Smoker  . Smokeless tobacco: Never Used  Substance Use Topics  . Alcohol use: Yes    Alcohol/week: 0.0 standard drinks    Comment: socially  . Drug use: No    Home Medications Prior to Admission medications   Medication Sig Start Date End Date Taking? Authorizing Provider  albuterol (VENTOLIN HFA) 108 (90 Base) MCG/ACT inhaler Inhale 1 puff into the lungs every 6 (six) hours as needed for wheezing or shortness of breath.  03/23/19  Yes [provider]  budesonide-formoterol (SYMBICORT) 80-4.5 MCG/ACT inhaler  TAKE 2 PUFFS FIRST THING IN THE MORNING AND THEN ANOTHER 2 PUFFS ABOUT 12 HOURS LATER. Patient taking differently: Inhale 2 puffs into the lungs in the morning and at bedtime.  08/19/17  Yes Tanda Rockers, MD  EPINEPHrine 0.3 mg/0.3 mL IJ SOAJ injection Inject 0.3 mg into the muscle as needed for anaphylaxis.  01/07/18  Yes [provider]  estradiol (ESTRACE) 0.1 MG/GM vaginal cream Place 1 Applicatorful vaginally daily.   Yes [provider]  fluticasone (FLONASE) 50 MCG/ACT nasal spray Place 2 sprays into both nostrils daily.  04/29/15  Yes [provider]  folic acid (FOLVITE) 1 MG tablet Take 1 mg by mouth daily. 12/04/17  Yes [provider]  levocetirizine (XYZAL) 5 MG tablet Take 5 mg by mouth every evening.  03/20/19  Yes [provider]  montelukast (SINGULAIR) 10 MG tablet Take 10 mg by mouth at bedtime.  05/30/15  Yes [provider]  rosuvastatin (CRESTOR) 5 MG tablet Take 5 mg by  mouth every evening.   Yes [provider]  Vitamin D, Ergocalciferol, (DRISDOL) 50000 units CAPS capsule Take 50,000 Units by mouth every 7 (seven) days.  01/24/18  Yes [provider]  azelastine (ASTELIN) 0.1 % nasal spray SPRAY 1 SPRAY INTO EACH NOSTRIL TWICE A DAY 03/10/19   [provider]  fexofenadine (ALLEGRA) 180 MG tablet Take 180 mg by mouth daily.    [provider]  neomycin-polymyxin-hydrocortisone (CORTISPORIN) OTIC solution Apply 1-2 drops to toe after soaking BID Patient not taking: Reported on 01/14/2020 04/02/19   Wallene Huh, DPM  predniSONE (DELTASONE) 10 MG tablet 5,4,3,2,1 taper Patient not taking: Reported on 01/14/2020 02/14/19   Fransico Meadow, PA-C  terbinafine (LAMISIL) 250 MG tablet PLEASE TAKE ONE A DAY X 7DAYS, REPEAT EVERY 4 WEEKS X 4 MONTHS Patient not taking: Please take one a day x 7days, repeat every 4 weeks x 4 months 04/30/19   Wallene Huh, DPM    Allergies    Patient has no known allergies.  Review of Systems   Review of Systems  Cardiovascular: Positive for syncope.  All other systems reviewed and are negative.   Physical Exam Updated Vital Signs BP 109/71   Pulse 85   Temp 98.2 F (36.8 C) (Oral)   Resp 20   Ht 5\' 6"  (1.676 m)   Wt 88 kg   LMP 06/30/2011   SpO2 96%   BMI 31.31 kg/m   Physical Exam Vitals and nursing note reviewed.  Constitutional:      Appearance: She is well-developed.  HENT:     Head: Normocephalic and atraumatic.  Cardiovascular:     Rate and Rhythm: Normal rate and regular rhythm.     Heart sounds: No murmur.  Pulmonary:     Effort: Pulmonary effort is normal. No respiratory distress.     Breath sounds: Normal breath sounds.  Abdominal:     Palpations: Abdomen is soft.     Tenderness: There is no abdominal tenderness. There is no guarding or rebound.  Musculoskeletal:        General: No swelling or tenderness.  Skin:    General: Skin is warm and dry.  Neurological:       Mental Status: She is alert and oriented to person, place, and time.     Comments: 5/5 strength in all four extremities with sensation to light touch intact in all four extremities.    Psychiatric:  Behavior: Behavior normal.     ED Results / Procedures / Treatments   Labs (all labs ordered are listed, but only abnormal results are displayed) Labs Reviewed  CBC - Abnormal; Notable for the following components:      Result Value   RDW 15.6 (*)    All other components within normal limits  D-DIMER, QUANTITATIVE (NOT AT [facility]) - Abnormal; Notable for the following components:   D-Dimer, Quant 1.62 (*)    All other components within normal limits  BASIC METABOLIC PANEL  URINALYSIS, ROUTINE W REFLEX MICROSCOPIC  CBG MONITORING, ED  CBG MONITORING, ED    EKG EKG Interpretation  Date/Time:  Thursday January 14 2020 22:20:18 EDT Ventricular Rate:  83 PR Interval:    QRS Duration: 93 QT Interval:  398 QTC Calculation: 468 R Axis:   -36 Text Interpretation: Sinus rhythm Borderline prolonged PR interval Left axis deviation Low voltage, precordial leads Probable anteroseptal infarct, old no prior available for comparison Confirmed by Quintella Reichert [phone]) on 01/14/2020 10:29:59 PM   Radiology CT Angio Chest PE W/Cm &/Or Wo Cm  Result Date: 01/14/2020 CLINICAL DATA:  Syncope with elevated D-dimer EXAM: CT ANGIOGRAPHY CHEST WITH CONTRAST TECHNIQUE: Multidetector CT imaging of the chest was performed using the standard protocol during bolus administration of intravenous contrast. Multiplanar CT image reconstructions and MIPs were obtained to evaluate the vascular anatomy. CONTRAST:  155mL OMNIPAQUE IOHEXOL 350 MG/ML SOLN COMPARISON:  Chest x-ray 02/14/2019 FINDINGS: Cardiovascular: Satisfactory opacification of the pulmonary arteries to the segmental level. No evidence of pulmonary embolism. Nonaneurysmal aorta. Mediastinum/Nodes: No enlarged mediastinal, hilar, or axillary lymph  nodes. Thyroid gland, trachea, and esophagus demonstrate no significant findings. Lungs/Pleura: Lungs are clear. No pleural effusion or pneumothorax. Upper Abdomen: No acute abnormality. Musculoskeletal: Bilateral breast prostheses. No acute osseous abnormality. Review of the MIP images confirms the above findings. IMPRESSION: 1. No CT evidence for acute pulmonary embolus. 2. Clear lung fields Electronically Signed   By: Donavan Foil M.D.   On: 01/14/2020 23:04    Procedures Procedures (including critical care time)  Medications Ordered in ED Medications  sodium chloride (PF) 0.9 % injection (has no administration in time range)  iohexol (OMNIPAQUE) 350 MG/ML injection 100 mL (100 mLs Intravenous Contrast Given 01/14/20 2252)    ED Course  I have reviewed the triage vital signs and the nursing notes.  Pertinent labs & imaging results that were available during my care of the patient were reviewed by me and considered in my medical decision making (see chart for details).    MDM Rules/Calculators/A&P                     Patient here for evaluation following near syncopal versus syncopal events. She is asymptomatic on ED evaluation. She does take premarin cream and therefore has increased risk of thromboembolism. EKG without acute ischemic changes, no evidence of arrhythmia at this time. D dimer is mildly  elevated and CTA was obtained, which was negative for pulmonary embolism. Presentation is not subarachnoid hemorrhage, dissection. Labs without significant electrolyte abnormality or anemia. Discussed with patient unclear source of syncopal event, recommend PCP follow-up as well as return for vascular ultrasound of bilateral lower extremities to rule out DVT. Return precautions discussed.  Final Clinical Impression(s) / ED Diagnoses Final diagnoses:  Syncope, unspecified syncope type    Rx / DC Orders ED Discharge Orders         Ordered    [facility]  01/14/20 2319             Quintella Reichert, MD 01/14/20 2349

## 2020-01-14 NOTE — ED Notes (Signed)
Urine and culture sent down to lab

## 2020-01-15 ENCOUNTER — Ambulatory Visit (HOSPITAL_COMMUNITY)
Admission: RE | Admit: 2020-01-15 | Discharge: 2020-01-15 | Disposition: A | Discharge status: Home / Self Care | Payer: BC Managed Care – PPO | Source: Ambulatory Visit | Attending: Emergency Medicine | Admitting: Emergency Medicine

## 2020-01-15 ENCOUNTER — Other Ambulatory Visit: Payer: Self-pay

## 2020-01-15 DIAGNOSIS — R55 Syncope and collapse: Secondary | ICD-10-CM | POA: Diagnosis not present

## 2020-01-15 NOTE — Progress Notes (Signed)
Lower venous duplex       has been completed. Preliminary results can be found under CV proc through chart review. Keniesha Adderly, BS, RDMS, RVT   

## 2020-01-26 ENCOUNTER — Institutional Professional Consult (permissible substitution): Payer: BC Managed Care – PPO | Admitting: Internal Medicine

## 2020-03-28 ENCOUNTER — Encounter: Payer: Self-pay | Admitting: Internal Medicine

## 2020-03-28 ENCOUNTER — Ambulatory Visit (INDEPENDENT_AMBULATORY_CARE_PROVIDER_SITE_OTHER): Payer: Medicare PPO

## 2020-03-28 ENCOUNTER — Other Ambulatory Visit: Payer: Self-pay

## 2020-03-28 ENCOUNTER — Ambulatory Visit: Payer: BC Managed Care – PPO | Admitting: Internal Medicine

## 2020-03-28 VITALS — BP 124/68 | HR 79 | Temp 97.7°F | Ht 66.0 in | Wt 201.2 lb

## 2020-03-28 DIAGNOSIS — J4540 Moderate persistent asthma, uncomplicated: Secondary | ICD-10-CM

## 2020-03-28 LAB — CBC WITH DIFFERENTIAL/PLATELET
Basophils Absolute: 0 10*3/uL (ref 0.0–0.1)
Basophils Relative: 0.8 % (ref 0.0–3.0)
Eosinophils Absolute: 0.5 10*3/uL (ref 0.0–0.7)
Eosinophils Relative: 7.4 % — ABNORMAL HIGH (ref 0.0–5.0)
HCT: 40.2 % (ref 36.0–46.0)
Hemoglobin: 13.2 g/dL (ref 12.0–15.0)
Lymphocytes Relative: 29.3 % (ref 12.0–46.0)
Lymphs Abs: 1.8 10*3/uL (ref 0.7–4.0)
MCHC: 32.9 g/dL (ref 30.0–36.0)
MCV: 88.7 fl (ref 78.0–100.0)
Monocytes Absolute: 0.4 10*3/uL (ref 0.1–1.0)
Monocytes Relative: 7.2 % (ref 3.0–12.0)
Neutro Abs: 3.4 10*3/uL (ref 1.4–7.7)
Neutrophils Relative %: 55.3 % (ref 43.0–77.0)
Platelets: 204 10*3/uL (ref 150.0–400.0)
RBC: 4.54 Mil/uL (ref 3.87–5.11)
RDW: 14.8 % (ref 11.5–15.5)
WBC: 6.2 10*3/uL (ref 4.0–10.5)

## 2020-03-28 MED ORDER — PREDNISONE 20 MG PO TABS: 40.0000 mg | ORAL_TABLET | Freq: Every day | ORAL | 1 refills | Status: DC

## 2020-03-28 NOTE — Addendum Note (Signed)
Addended by: Suzzanne Cloud E on: 03/28/2020 11:51 AM   Modules accepted: Orders

## 2020-03-28 NOTE — Progress Notes (Signed)
Synopsis: Asthma  Assessment & Plan:  Problem 1 Persistent severe asthma:  Denies GERD.  On appropriate anti-allergy medications and lifestyle modifications. Helped greatly with mask wearing and social distancing.   - CXR in office for focal right lower lung field wheezing on exam - Check IgE, CBC in case control worsens as social distancing measures ease up - Continue symbicort, astelin, allegra, flonase, xyzal, singulair - Flutter valve to help secretion clearance - Rescue steroid course ordered in case has flare (had to go to ER last time because her doctor's office closed) - She will let us know if worsened control and we can consider either (A) increasing symbicort (B) adding LAMA or (C) considering biologic - follow up in 1 year or sooner if feeling unwell  MDM . I reviewed prior external note(s) from [facility] on 02/14/19 . I reviewed the result(s) of CBC 4/121 with normal WBC, no differential sent . I have ordered IgE, CBC w/ diff, rescue steroid packs to fill PRN, and a flutter device  Review of patient's CTA chest 01/14/20 images reveal clear lungs, no PE. The patient's images have been independently reviewed by me.     End of visit medications:  Current Outpatient Medications:  .  albuterol (VENTOLIN HFA) 108 (90 Base) MCG/ACT inhaler, Inhale 1 puff into the lungs every 6 (six) hours as needed for wheezing or shortness of breath. , Disp: , Rfl:  .  azelastine (ASTELIN) 0.1 % nasal spray, SPRAY 1 SPRAY INTO EACH NOSTRIL TWICE A DAY, Disp: , Rfl:  .  budesonide-formoterol (SYMBICORT) 80-4.5 MCG/ACT inhaler, TAKE 2 PUFFS FIRST THING IN THE MORNING AND THEN ANOTHER 2 PUFFS ABOUT 12 HOURS LATER. (Patient taking differently: Inhale 2 puffs into the lungs in the morning and at bedtime. ), Disp: 10.2 Inhaler, Rfl: 10 .  EPINEPHrine 0.3 mg/0.3 mL IJ SOAJ injection, Inject 0.3 mg into the muscle as needed for anaphylaxis. , Disp: , Rfl: 0 .  estradiol (ESTRACE) 0.1 MG/GM vaginal  cream, Place 1 Applicatorful vaginally daily., Disp: , Rfl:  .  fexofenadine (ALLEGRA) 180 MG tablet, Take 180 mg by mouth daily., Disp: , Rfl:  .  fluticasone (FLONASE) 50 MCG/ACT nasal spray, Place 2 sprays into both nostrils daily. , Disp: , Rfl: 0 .  folic acid (FOLVITE) 1 MG tablet, Take 1 mg by mouth daily., Disp: , Rfl: 3 .  levocetirizine (XYZAL) 5 MG tablet, Take 5 mg by mouth every evening. , Disp: , Rfl:  .  montelukast (SINGULAIR) 10 MG tablet, Take 10 mg by mouth at bedtime. , Disp: , Rfl:  .  neomycin-polymyxin-hydrocortisone (CORTISPORIN) OTIC solution, Apply 1-2 drops to toe after soaking BID, Disp: 10 mL, Rfl: 1 .  rosuvastatin (CRESTOR) 5 MG tablet, Take 5 mg by mouth every evening., Disp: , Rfl:  .  terbinafine (LAMISIL) 250 MG tablet, PLEASE TAKE ONE A DAY X 7DAYS, REPEAT EVERY 4 WEEKS X 4 MONTHS, Disp: 28 tablet, Rfl: 0 .  Vitamin D, Ergocalciferol, (DRISDOL) 50000 units CAPS capsule, Take 50,000 Units by mouth every 7 (seven) days. , Disp: , Rfl: 1 .  predniSONE (DELTASONE) 20 MG tablet, Take 2 tablets (40 mg total) by mouth daily with breakfast., Disp: 14 tablet, Rfl: 1  Current Facility-Administered Medications:  .  0.9 %  sodium chloride infusion, 500 mL, Intravenous, Once, Ebony Jimenez, Ebony Lines, MD   Ebony Furbish, MD [facility] 03/28/2020 11:20 AM    Subjective:   PATIENT ID: Ebony Jimenez  Ebony Jimenez GENDER: female DOB: 04/05/1956, MRN: [id number]  Chief Complaint  Patient presents with  . Consult    history of Asthma     HPI Here for asthma. Used to follow with Dr. Melvyn Novas Denies GERD Has lots of allergic symptoms, used to be on allergy shots. Before COVID, had about 3 flares a year that required steroids. Never intubated. Triggers are weather changes, strong smells. 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  Ancillary information including prior medications, full  medical/surgical/family/social histoies, and PFTs (when available) are listed below and have been reviewed.   ROS + symptoms in bold Fevers, chills, weight loss Nausea, vomiting, diarrhea Shortness of breath, wheezing, cough Chest pain, palpitations, lower ext edema   Objective:   Vitals:   03/28/20 1058  BP: 124/68  Pulse: 79  Temp: 97.7 F (36.5 C)  TempSrc: Oral  SpO2: 94%  Weight: 201 lb 3.2 oz (91.3 kg)  Height: 5\' 6"  (1.676 m)   94% on RA BMI Readings from Last 3 Encounters:  03/28/20 32.47 kg/m  01/14/20 31.31 kg/m  02/14/19 31.47 kg/m   Wt Readings from Last 3 Encounters:  03/28/20 201 lb 3.2 oz (91.3 kg)  01/14/20 194 lb (88 kg)  02/14/19 195 lb (88.5 kg)    GEN: 64 year old woman in no acute distress HEENT: trachea midline, mucus membranes moist CV: Regular rate and rhythm, extremities are warm PULM: she has focal wheezing in right lower lung field, everywhere else is clear, no accessory muscle use GI: Soft, +BS EXT: no edema NEURO: Moves all 4 extremities   Ancillary Information    Past Medical History:  Diagnosis Date  . Allergy   . Arthritis    foot   . Asthma   . Hyperlipidemia    diet controlled     Family History  Problem Relation Age of Onset  . Asthma Brother   . Asthma Mother   . Heart disease Mother   . Asthma Sister   . Cancer Father        mouth- never smoked  . Colon cancer Neg Hx   . Colon polyps Neg Hx   . Esophageal cancer Neg Hx   . Rectal cancer Neg Hx   . Stomach cancer Neg Hx      Past Surgical History:  Procedure Laterality Date  . BREAST ENHANCEMENT SURGERY    . COLONOSCOPY     10 yrs ago   . DILATION AND CURETTAGE OF UTERUS    . HAND SURGERY Right    age 21  . POLYPECTOMY     pre cancerous polyps per pt at colon 10 yrs ago -   . RETAINED PLACENTA REMOVAL      Social History   Socioeconomic History  . Marital status: Significant Other    Spouse name: Not on file  . Number of children: Not on  file  . Years of education: Not on file  . Highest education level: Not on file  Occupational History  . Not on file  Tobacco Use  . Smoking status: Never Smoker  . Smokeless tobacco: Never Used  Substance and Sexual Activity  . Alcohol use: Yes    Alcohol/week: 0.0 standard drinks    Comment: socially  . Drug use: No  . Sexual activity: Not on file  Other Topics Concern  . Not on file  Social History Narrative  . Not on file  Social Determinants of Health   Financial Resource Strain:   . Difficulty of Paying Living Expenses:   Food Insecurity:   . Worried About Charity fundraiser in the Last Year:   . Arboriculturist in the Last Year:   Transportation Needs:   . Film/video editor (Medical):   Marland Kitchen Lack of Transportation (Non-Medical):   Physical Activity:   . Days of Exercise per Week:   . Minutes of Exercise per Session:   Stress:   . Feeling of Stress :   Social Connections:   . Frequency of Communication with Friends and Family:   . Frequency of Social Gatherings with Friends and Family:   . Attends Religious Services:   . Active Member of Clubs or Organizations:   . Attends Archivist Meetings:   Marland Kitchen Marital Status:   Intimate Partner Violence:   . Fear of Current or Ex-Partner:   . Emotionally Abused:   Marland Kitchen Physically Abused:   . Sexually Abused:      No Known Allergies   CBC    Component Value Date/Time   WBC 7.9 01/14/2020 1843   RBC 4.64 01/14/2020 1843   HGB 13.2 01/14/2020 1843   HCT 42.4 01/14/2020 1843   PLT 239 01/14/2020 1843   MCV 91.4 01/14/2020 1843   MCH 28.4 01/14/2020 1843   MCHC 31.1 01/14/2020 1843   RDW 15.6 (H) 01/14/2020 1843    Pulmonary Functions Testing Results: No flowsheet data found.  Outpatient Medications Prior to Visit  Medication Sig Dispense Refill  . albuterol (VENTOLIN HFA) 108 (90 Base) MCG/ACT inhaler Inhale 1 puff into the lungs every 6 (six) hours as needed for wheezing or shortness of breath.      Marland Kitchen azelastine (ASTELIN) 0.1 % nasal spray SPRAY 1 SPRAY INTO EACH NOSTRIL TWICE A DAY    . budesonide-formoterol (SYMBICORT) 80-4.5 MCG/ACT inhaler TAKE 2 PUFFS FIRST THING IN THE MORNING AND THEN ANOTHER 2 PUFFS ABOUT 12 HOURS LATER. (Patient taking differently: Inhale 2 puffs into the lungs in the morning and at bedtime. ) 10.2 Inhaler 10  . EPINEPHrine 0.3 mg/0.3 mL IJ SOAJ injection Inject 0.3 mg into the muscle as needed for anaphylaxis.   0  . estradiol (ESTRACE) 0.1 MG/GM vaginal cream Place 1 Applicatorful vaginally daily.    . fexofenadine (ALLEGRA) 180 MG tablet Take 180 mg by mouth daily.    . fluticasone (FLONASE) 50 MCG/ACT nasal spray Place 2 sprays into both nostrils daily.   0  . folic acid (FOLVITE) 1 MG tablet Take 1 mg by mouth daily.  3  . levocetirizine (XYZAL) 5 MG tablet Take 5 mg by mouth every evening.     . montelukast (SINGULAIR) 10 MG tablet Take 10 mg by mouth at bedtime.     Marland Kitchen neomycin-polymyxin-hydrocortisone (CORTISPORIN) OTIC solution Apply 1-2 drops to toe after soaking BID 10 mL 1  . rosuvastatin (CRESTOR) 5 MG tablet Take 5 mg by mouth every evening.    . terbinafine (LAMISIL) 250 MG tablet PLEASE TAKE ONE A DAY X 7DAYS, REPEAT EVERY 4 WEEKS X 4 MONTHS 28 tablet 0  . Vitamin D, Ergocalciferol, (DRISDOL) 50000 units CAPS capsule Take 50,000 Units by mouth every 7 (seven) days.   1  . predniSONE (DELTASONE) 10 MG tablet 5,4,3,2,1 taper 15 tablet 0   Facility-Administered Medications Prior to Visit  Medication Dose Route Frequency Provider Last Rate Last Admin  . 0.9 %  sodium chloride infusion  500  mL Intravenous Once Ebony Jimenez, Ebony Lines, MD

## 2020-03-28 NOTE — Patient Instructions (Signed)
-   Check lab work - Try flutter device to help clear out sputum - Prednisone if you are getting an attack - If you find yourself needing prednisone more than once every couple months, let me know and I can intensify your asthma medications - Return in 1 year or sooner if feeling unwell

## 2020-03-29 LAB — IGE: IgE (Immunoglobulin E), Serum: 185 kU/L — ABNORMAL HIGH (ref <=114)

## 2020-03-30 ENCOUNTER — Telehealth: Payer: Self-pay | Admitting: Internal Medicine

## 2020-03-30 NOTE — Telephone Encounter (Signed)
Spoke with patient. She was calling to get the results from her labs from her 6/14. She is concerned because she saw some of the numbers were elevated.   Dr. Tamala Julian, please advise. Thanks!

## 2020-03-31 NOTE — Telephone Encounter (Signed)
Awaiting response from Dr. Tamala Julian.  Will follow up with patient once we receive a response.

## 2020-04-04 NOTE — Telephone Encounter (Signed)
Dr. Tamala Julian please advise on patients lab work

## 2020-04-04 NOTE — Telephone Encounter (Signed)
Called and updated, all questions answered.

## 2020-04-04 NOTE — Telephone Encounter (Signed)
Thank you :)

## 2020-06-15 DIAGNOSIS — J3081 Allergic rhinitis due to animal (cat) (dog) hair and dander: Secondary | ICD-10-CM | POA: Diagnosis not present

## 2020-06-15 DIAGNOSIS — J3089 Other allergic rhinitis: Secondary | ICD-10-CM | POA: Diagnosis not present

## 2020-06-15 DIAGNOSIS — J301 Allergic rhinitis due to pollen: Secondary | ICD-10-CM | POA: Diagnosis not present

## 2020-06-23 DIAGNOSIS — J3089 Other allergic rhinitis: Secondary | ICD-10-CM | POA: Diagnosis not present

## 2020-06-23 DIAGNOSIS — J301 Allergic rhinitis due to pollen: Secondary | ICD-10-CM | POA: Diagnosis not present

## 2020-06-23 DIAGNOSIS — J3081 Allergic rhinitis due to animal (cat) (dog) hair and dander: Secondary | ICD-10-CM | POA: Diagnosis not present

## 2020-06-27 DIAGNOSIS — Z1231 Encounter for screening mammogram for malignant neoplasm of breast: Secondary | ICD-10-CM | POA: Diagnosis not present

## 2020-06-30 DIAGNOSIS — J3089 Other allergic rhinitis: Secondary | ICD-10-CM | POA: Diagnosis not present

## 2020-06-30 DIAGNOSIS — J301 Allergic rhinitis due to pollen: Secondary | ICD-10-CM | POA: Diagnosis not present

## 2020-06-30 DIAGNOSIS — J3081 Allergic rhinitis due to animal (cat) (dog) hair and dander: Secondary | ICD-10-CM | POA: Diagnosis not present

## 2020-07-05 DIAGNOSIS — J3089 Other allergic rhinitis: Secondary | ICD-10-CM | POA: Diagnosis not present

## 2020-07-05 DIAGNOSIS — J3081 Allergic rhinitis due to animal (cat) (dog) hair and dander: Secondary | ICD-10-CM | POA: Diagnosis not present

## 2020-07-05 DIAGNOSIS — J301 Allergic rhinitis due to pollen: Secondary | ICD-10-CM | POA: Diagnosis not present

## 2020-07-08 DIAGNOSIS — J301 Allergic rhinitis due to pollen: Secondary | ICD-10-CM | POA: Diagnosis not present

## 2020-07-08 DIAGNOSIS — J3089 Other allergic rhinitis: Secondary | ICD-10-CM | POA: Diagnosis not present

## 2020-07-08 DIAGNOSIS — J3081 Allergic rhinitis due to animal (cat) (dog) hair and dander: Secondary | ICD-10-CM | POA: Diagnosis not present

## 2020-07-11 DIAGNOSIS — J301 Allergic rhinitis due to pollen: Secondary | ICD-10-CM | POA: Diagnosis not present

## 2020-07-11 DIAGNOSIS — J3081 Allergic rhinitis due to animal (cat) (dog) hair and dander: Secondary | ICD-10-CM | POA: Diagnosis not present

## 2020-07-11 DIAGNOSIS — J4540 Moderate persistent asthma, uncomplicated: Secondary | ICD-10-CM | POA: Diagnosis not present

## 2020-07-11 DIAGNOSIS — J3089 Other allergic rhinitis: Secondary | ICD-10-CM | POA: Diagnosis not present

## 2020-07-15 DIAGNOSIS — J3081 Allergic rhinitis due to animal (cat) (dog) hair and dander: Secondary | ICD-10-CM | POA: Diagnosis not present

## 2020-07-15 DIAGNOSIS — J3089 Other allergic rhinitis: Secondary | ICD-10-CM | POA: Diagnosis not present

## 2020-07-15 DIAGNOSIS — J301 Allergic rhinitis due to pollen: Secondary | ICD-10-CM | POA: Diagnosis not present

## 2020-07-27 DIAGNOSIS — J301 Allergic rhinitis due to pollen: Secondary | ICD-10-CM | POA: Diagnosis not present

## 2020-07-27 DIAGNOSIS — J3081 Allergic rhinitis due to animal (cat) (dog) hair and dander: Secondary | ICD-10-CM | POA: Diagnosis not present

## 2020-07-27 DIAGNOSIS — J3089 Other allergic rhinitis: Secondary | ICD-10-CM | POA: Diagnosis not present

## 2020-08-03 DIAGNOSIS — J3081 Allergic rhinitis due to animal (cat) (dog) hair and dander: Secondary | ICD-10-CM | POA: Diagnosis not present

## 2020-08-03 DIAGNOSIS — J3089 Other allergic rhinitis: Secondary | ICD-10-CM | POA: Diagnosis not present

## 2020-08-03 DIAGNOSIS — J301 Allergic rhinitis due to pollen: Secondary | ICD-10-CM | POA: Diagnosis not present

## 2020-08-09 DIAGNOSIS — J301 Allergic rhinitis due to pollen: Secondary | ICD-10-CM | POA: Diagnosis not present

## 2020-08-09 DIAGNOSIS — J3089 Other allergic rhinitis: Secondary | ICD-10-CM | POA: Diagnosis not present

## 2020-08-09 DIAGNOSIS — J3081 Allergic rhinitis due to animal (cat) (dog) hair and dander: Secondary | ICD-10-CM | POA: Diagnosis not present

## 2020-08-16 DIAGNOSIS — J3081 Allergic rhinitis due to animal (cat) (dog) hair and dander: Secondary | ICD-10-CM | POA: Diagnosis not present

## 2020-08-16 DIAGNOSIS — J3089 Other allergic rhinitis: Secondary | ICD-10-CM | POA: Diagnosis not present

## 2020-08-16 DIAGNOSIS — J301 Allergic rhinitis due to pollen: Secondary | ICD-10-CM | POA: Diagnosis not present

## 2020-08-22 DIAGNOSIS — J301 Allergic rhinitis due to pollen: Secondary | ICD-10-CM | POA: Diagnosis not present

## 2020-08-22 DIAGNOSIS — J3081 Allergic rhinitis due to animal (cat) (dog) hair and dander: Secondary | ICD-10-CM | POA: Diagnosis not present

## 2020-08-24 DIAGNOSIS — J3081 Allergic rhinitis due to animal (cat) (dog) hair and dander: Secondary | ICD-10-CM | POA: Diagnosis not present

## 2020-08-24 DIAGNOSIS — J3089 Other allergic rhinitis: Secondary | ICD-10-CM | POA: Diagnosis not present

## 2020-08-24 DIAGNOSIS — J301 Allergic rhinitis due to pollen: Secondary | ICD-10-CM | POA: Diagnosis not present

## 2020-08-31 DIAGNOSIS — J301 Allergic rhinitis due to pollen: Secondary | ICD-10-CM | POA: Diagnosis not present

## 2020-08-31 DIAGNOSIS — J3081 Allergic rhinitis due to animal (cat) (dog) hair and dander: Secondary | ICD-10-CM | POA: Diagnosis not present

## 2020-08-31 DIAGNOSIS — J3089 Other allergic rhinitis: Secondary | ICD-10-CM | POA: Diagnosis not present

## 2020-09-02 DIAGNOSIS — J3089 Other allergic rhinitis: Secondary | ICD-10-CM | POA: Diagnosis not present

## 2020-09-02 DIAGNOSIS — J301 Allergic rhinitis due to pollen: Secondary | ICD-10-CM | POA: Diagnosis not present

## 2020-09-02 DIAGNOSIS — J3081 Allergic rhinitis due to animal (cat) (dog) hair and dander: Secondary | ICD-10-CM | POA: Diagnosis not present

## 2020-09-05 DIAGNOSIS — J301 Allergic rhinitis due to pollen: Secondary | ICD-10-CM | POA: Diagnosis not present

## 2020-09-05 DIAGNOSIS — J3089 Other allergic rhinitis: Secondary | ICD-10-CM | POA: Diagnosis not present

## 2020-09-05 DIAGNOSIS — J3081 Allergic rhinitis due to animal (cat) (dog) hair and dander: Secondary | ICD-10-CM | POA: Diagnosis not present

## 2020-09-07 DIAGNOSIS — J3081 Allergic rhinitis due to animal (cat) (dog) hair and dander: Secondary | ICD-10-CM | POA: Diagnosis not present

## 2020-09-07 DIAGNOSIS — J301 Allergic rhinitis due to pollen: Secondary | ICD-10-CM | POA: Diagnosis not present

## 2020-09-14 DIAGNOSIS — J3089 Other allergic rhinitis: Secondary | ICD-10-CM | POA: Diagnosis not present

## 2020-09-14 DIAGNOSIS — J301 Allergic rhinitis due to pollen: Secondary | ICD-10-CM | POA: Diagnosis not present

## 2020-09-14 DIAGNOSIS — J3081 Allergic rhinitis due to animal (cat) (dog) hair and dander: Secondary | ICD-10-CM | POA: Diagnosis not present

## 2020-09-21 DIAGNOSIS — J301 Allergic rhinitis due to pollen: Secondary | ICD-10-CM | POA: Diagnosis not present

## 2020-09-21 DIAGNOSIS — J3081 Allergic rhinitis due to animal (cat) (dog) hair and dander: Secondary | ICD-10-CM | POA: Diagnosis not present

## 2020-09-21 DIAGNOSIS — J3089 Other allergic rhinitis: Secondary | ICD-10-CM | POA: Diagnosis not present

## 2020-09-28 DIAGNOSIS — J3089 Other allergic rhinitis: Secondary | ICD-10-CM | POA: Diagnosis not present

## 2020-09-28 DIAGNOSIS — J301 Allergic rhinitis due to pollen: Secondary | ICD-10-CM | POA: Diagnosis not present

## 2020-09-28 DIAGNOSIS — J3081 Allergic rhinitis due to animal (cat) (dog) hair and dander: Secondary | ICD-10-CM | POA: Diagnosis not present

## 2020-10-06 DIAGNOSIS — J3089 Other allergic rhinitis: Secondary | ICD-10-CM | POA: Diagnosis not present

## 2020-10-06 DIAGNOSIS — J301 Allergic rhinitis due to pollen: Secondary | ICD-10-CM | POA: Diagnosis not present

## 2020-10-06 DIAGNOSIS — J3081 Allergic rhinitis due to animal (cat) (dog) hair and dander: Secondary | ICD-10-CM | POA: Diagnosis not present

## 2020-11-02 DIAGNOSIS — J3081 Allergic rhinitis due to animal (cat) (dog) hair and dander: Secondary | ICD-10-CM | POA: Diagnosis not present

## 2020-11-02 DIAGNOSIS — J301 Allergic rhinitis due to pollen: Secondary | ICD-10-CM | POA: Diagnosis not present

## 2020-11-02 DIAGNOSIS — J3089 Other allergic rhinitis: Secondary | ICD-10-CM | POA: Diagnosis not present

## 2020-11-07 DIAGNOSIS — J301 Allergic rhinitis due to pollen: Secondary | ICD-10-CM | POA: Diagnosis not present

## 2020-11-07 DIAGNOSIS — J3089 Other allergic rhinitis: Secondary | ICD-10-CM | POA: Diagnosis not present

## 2020-11-07 DIAGNOSIS — J3081 Allergic rhinitis due to animal (cat) (dog) hair and dander: Secondary | ICD-10-CM | POA: Diagnosis not present

## 2020-11-14 DIAGNOSIS — J301 Allergic rhinitis due to pollen: Secondary | ICD-10-CM | POA: Diagnosis not present

## 2020-11-14 DIAGNOSIS — M8580 Other specified disorders of bone density and structure, unspecified site: Secondary | ICD-10-CM | POA: Diagnosis not present

## 2020-11-14 DIAGNOSIS — J3089 Other allergic rhinitis: Secondary | ICD-10-CM | POA: Diagnosis not present

## 2020-11-14 DIAGNOSIS — J3081 Allergic rhinitis due to animal (cat) (dog) hair and dander: Secondary | ICD-10-CM | POA: Diagnosis not present

## 2020-11-14 DIAGNOSIS — L989 Disorder of the skin and subcutaneous tissue, unspecified: Secondary | ICD-10-CM | POA: Diagnosis not present

## 2020-11-14 DIAGNOSIS — Z23 Encounter for immunization: Secondary | ICD-10-CM | POA: Diagnosis not present

## 2020-11-17 ENCOUNTER — Other Ambulatory Visit: Payer: Self-pay | Admitting: Family Medicine

## 2020-11-17 DIAGNOSIS — M8580 Other specified disorders of bone density and structure, unspecified site: Secondary | ICD-10-CM

## 2020-11-24 DIAGNOSIS — J301 Allergic rhinitis due to pollen: Secondary | ICD-10-CM | POA: Diagnosis not present

## 2020-11-24 DIAGNOSIS — J3081 Allergic rhinitis due to animal (cat) (dog) hair and dander: Secondary | ICD-10-CM | POA: Diagnosis not present

## 2020-11-24 DIAGNOSIS — J3089 Other allergic rhinitis: Secondary | ICD-10-CM | POA: Diagnosis not present

## 2020-12-01 DIAGNOSIS — J3081 Allergic rhinitis due to animal (cat) (dog) hair and dander: Secondary | ICD-10-CM | POA: Diagnosis not present

## 2020-12-01 DIAGNOSIS — J3089 Other allergic rhinitis: Secondary | ICD-10-CM | POA: Diagnosis not present

## 2020-12-01 DIAGNOSIS — J301 Allergic rhinitis due to pollen: Secondary | ICD-10-CM | POA: Diagnosis not present

## 2020-12-05 DIAGNOSIS — J301 Allergic rhinitis due to pollen: Secondary | ICD-10-CM | POA: Diagnosis not present

## 2020-12-05 DIAGNOSIS — L905 Scar conditions and fibrosis of skin: Secondary | ICD-10-CM | POA: Diagnosis not present

## 2020-12-05 DIAGNOSIS — L821 Other seborrheic keratosis: Secondary | ICD-10-CM | POA: Diagnosis not present

## 2020-12-05 DIAGNOSIS — Z85828 Personal history of other malignant neoplasm of skin: Secondary | ICD-10-CM | POA: Diagnosis not present

## 2020-12-05 DIAGNOSIS — J3089 Other allergic rhinitis: Secondary | ICD-10-CM | POA: Diagnosis not present

## 2020-12-05 DIAGNOSIS — J3081 Allergic rhinitis due to animal (cat) (dog) hair and dander: Secondary | ICD-10-CM | POA: Diagnosis not present

## 2020-12-12 DIAGNOSIS — J3089 Other allergic rhinitis: Secondary | ICD-10-CM | POA: Diagnosis not present

## 2020-12-14 DIAGNOSIS — J3089 Other allergic rhinitis: Secondary | ICD-10-CM | POA: Diagnosis not present

## 2020-12-14 DIAGNOSIS — J301 Allergic rhinitis due to pollen: Secondary | ICD-10-CM | POA: Diagnosis not present

## 2020-12-14 DIAGNOSIS — J3081 Allergic rhinitis due to animal (cat) (dog) hair and dander: Secondary | ICD-10-CM | POA: Diagnosis not present

## 2020-12-22 DIAGNOSIS — J3081 Allergic rhinitis due to animal (cat) (dog) hair and dander: Secondary | ICD-10-CM | POA: Diagnosis not present

## 2020-12-22 DIAGNOSIS — J301 Allergic rhinitis due to pollen: Secondary | ICD-10-CM | POA: Diagnosis not present

## 2020-12-22 DIAGNOSIS — J3089 Other allergic rhinitis: Secondary | ICD-10-CM | POA: Diagnosis not present

## 2020-12-29 DIAGNOSIS — J3089 Other allergic rhinitis: Secondary | ICD-10-CM | POA: Diagnosis not present

## 2020-12-29 DIAGNOSIS — J301 Allergic rhinitis due to pollen: Secondary | ICD-10-CM | POA: Diagnosis not present

## 2020-12-29 DIAGNOSIS — J3081 Allergic rhinitis due to animal (cat) (dog) hair and dander: Secondary | ICD-10-CM | POA: Diagnosis not present

## 2020-12-30 DIAGNOSIS — Z0000 Encounter for general adult medical examination without abnormal findings: Secondary | ICD-10-CM | POA: Diagnosis not present

## 2020-12-30 DIAGNOSIS — E559 Vitamin D deficiency, unspecified: Secondary | ICD-10-CM | POA: Diagnosis not present

## 2020-12-30 DIAGNOSIS — E7800 Pure hypercholesterolemia, unspecified: Secondary | ICD-10-CM | POA: Diagnosis not present

## 2020-12-30 DIAGNOSIS — F520 Hypoactive sexual desire disorder: Secondary | ICD-10-CM | POA: Diagnosis not present

## 2020-12-30 DIAGNOSIS — M8589 Other specified disorders of bone density and structure, multiple sites: Secondary | ICD-10-CM | POA: Diagnosis not present

## 2021-01-02 DIAGNOSIS — J3081 Allergic rhinitis due to animal (cat) (dog) hair and dander: Secondary | ICD-10-CM | POA: Diagnosis not present

## 2021-01-02 DIAGNOSIS — J3089 Other allergic rhinitis: Secondary | ICD-10-CM | POA: Diagnosis not present

## 2021-01-02 DIAGNOSIS — J301 Allergic rhinitis due to pollen: Secondary | ICD-10-CM | POA: Diagnosis not present

## 2021-01-10 DIAGNOSIS — J301 Allergic rhinitis due to pollen: Secondary | ICD-10-CM | POA: Diagnosis not present

## 2021-01-10 DIAGNOSIS — J3081 Allergic rhinitis due to animal (cat) (dog) hair and dander: Secondary | ICD-10-CM | POA: Diagnosis not present

## 2021-01-10 DIAGNOSIS — J3089 Other allergic rhinitis: Secondary | ICD-10-CM | POA: Diagnosis not present

## 2021-01-13 DIAGNOSIS — B372 Candidiasis of skin and nail: Secondary | ICD-10-CM | POA: Diagnosis not present

## 2021-01-13 DIAGNOSIS — Z01419 Encounter for gynecological examination (general) (routine) without abnormal findings: Secondary | ICD-10-CM | POA: Diagnosis not present

## 2021-01-13 DIAGNOSIS — N952 Postmenopausal atrophic vaginitis: Secondary | ICD-10-CM | POA: Diagnosis not present

## 2021-01-13 DIAGNOSIS — Z124 Encounter for screening for malignant neoplasm of cervix: Secondary | ICD-10-CM | POA: Diagnosis not present

## 2021-01-13 DIAGNOSIS — Z01411 Encounter for gynecological examination (general) (routine) with abnormal findings: Secondary | ICD-10-CM | POA: Diagnosis not present

## 2021-01-13 DIAGNOSIS — Z6833 Body mass index (BMI) 33.0-33.9, adult: Secondary | ICD-10-CM | POA: Diagnosis not present

## 2021-01-18 DIAGNOSIS — J301 Allergic rhinitis due to pollen: Secondary | ICD-10-CM | POA: Diagnosis not present

## 2021-01-18 DIAGNOSIS — J3089 Other allergic rhinitis: Secondary | ICD-10-CM | POA: Diagnosis not present

## 2021-01-18 DIAGNOSIS — J3081 Allergic rhinitis due to animal (cat) (dog) hair and dander: Secondary | ICD-10-CM | POA: Diagnosis not present

## 2021-01-25 DIAGNOSIS — J301 Allergic rhinitis due to pollen: Secondary | ICD-10-CM | POA: Diagnosis not present

## 2021-01-25 DIAGNOSIS — J3089 Other allergic rhinitis: Secondary | ICD-10-CM | POA: Diagnosis not present

## 2021-01-25 DIAGNOSIS — J3081 Allergic rhinitis due to animal (cat) (dog) hair and dander: Secondary | ICD-10-CM | POA: Diagnosis not present

## 2021-02-01 DIAGNOSIS — J3089 Other allergic rhinitis: Secondary | ICD-10-CM | POA: Diagnosis not present

## 2021-02-01 DIAGNOSIS — J3081 Allergic rhinitis due to animal (cat) (dog) hair and dander: Secondary | ICD-10-CM | POA: Diagnosis not present

## 2021-02-01 DIAGNOSIS — J301 Allergic rhinitis due to pollen: Secondary | ICD-10-CM | POA: Diagnosis not present

## 2021-02-08 DIAGNOSIS — J301 Allergic rhinitis due to pollen: Secondary | ICD-10-CM | POA: Diagnosis not present

## 2021-02-08 DIAGNOSIS — J3089 Other allergic rhinitis: Secondary | ICD-10-CM | POA: Diagnosis not present

## 2021-02-08 DIAGNOSIS — J3081 Allergic rhinitis due to animal (cat) (dog) hair and dander: Secondary | ICD-10-CM | POA: Diagnosis not present

## 2021-02-15 DIAGNOSIS — J3089 Other allergic rhinitis: Secondary | ICD-10-CM | POA: Diagnosis not present

## 2021-02-15 DIAGNOSIS — J301 Allergic rhinitis due to pollen: Secondary | ICD-10-CM | POA: Diagnosis not present

## 2021-02-15 DIAGNOSIS — J3081 Allergic rhinitis due to animal (cat) (dog) hair and dander: Secondary | ICD-10-CM | POA: Diagnosis not present

## 2021-02-22 DIAGNOSIS — J3089 Other allergic rhinitis: Secondary | ICD-10-CM | POA: Diagnosis not present

## 2021-02-22 DIAGNOSIS — J301 Allergic rhinitis due to pollen: Secondary | ICD-10-CM | POA: Diagnosis not present

## 2021-02-22 DIAGNOSIS — J3081 Allergic rhinitis due to animal (cat) (dog) hair and dander: Secondary | ICD-10-CM | POA: Diagnosis not present

## 2021-02-28 DIAGNOSIS — J301 Allergic rhinitis due to pollen: Secondary | ICD-10-CM | POA: Diagnosis not present

## 2021-02-28 DIAGNOSIS — J3081 Allergic rhinitis due to animal (cat) (dog) hair and dander: Secondary | ICD-10-CM | POA: Diagnosis not present

## 2021-02-28 DIAGNOSIS — J3089 Other allergic rhinitis: Secondary | ICD-10-CM | POA: Diagnosis not present

## 2021-03-07 DIAGNOSIS — J301 Allergic rhinitis due to pollen: Secondary | ICD-10-CM | POA: Diagnosis not present

## 2021-03-07 DIAGNOSIS — J3081 Allergic rhinitis due to animal (cat) (dog) hair and dander: Secondary | ICD-10-CM | POA: Diagnosis not present

## 2021-03-07 DIAGNOSIS — J3089 Other allergic rhinitis: Secondary | ICD-10-CM | POA: Diagnosis not present

## 2021-03-14 DIAGNOSIS — J301 Allergic rhinitis due to pollen: Secondary | ICD-10-CM | POA: Diagnosis not present

## 2021-03-14 DIAGNOSIS — J3081 Allergic rhinitis due to animal (cat) (dog) hair and dander: Secondary | ICD-10-CM | POA: Diagnosis not present

## 2021-03-14 DIAGNOSIS — J3089 Other allergic rhinitis: Secondary | ICD-10-CM | POA: Diagnosis not present

## 2021-03-22 DIAGNOSIS — J3089 Other allergic rhinitis: Secondary | ICD-10-CM | POA: Diagnosis not present

## 2021-03-22 DIAGNOSIS — J301 Allergic rhinitis due to pollen: Secondary | ICD-10-CM | POA: Diagnosis not present

## 2021-03-22 DIAGNOSIS — J3081 Allergic rhinitis due to animal (cat) (dog) hair and dander: Secondary | ICD-10-CM | POA: Diagnosis not present

## 2021-03-28 DIAGNOSIS — J301 Allergic rhinitis due to pollen: Secondary | ICD-10-CM | POA: Diagnosis not present

## 2021-03-28 DIAGNOSIS — J3089 Other allergic rhinitis: Secondary | ICD-10-CM | POA: Diagnosis not present

## 2021-03-28 DIAGNOSIS — J3081 Allergic rhinitis due to animal (cat) (dog) hair and dander: Secondary | ICD-10-CM | POA: Diagnosis not present

## 2021-04-04 DIAGNOSIS — J3089 Other allergic rhinitis: Secondary | ICD-10-CM | POA: Diagnosis not present

## 2021-04-04 DIAGNOSIS — J3081 Allergic rhinitis due to animal (cat) (dog) hair and dander: Secondary | ICD-10-CM | POA: Diagnosis not present

## 2021-04-04 DIAGNOSIS — J301 Allergic rhinitis due to pollen: Secondary | ICD-10-CM | POA: Diagnosis not present

## 2021-04-06 DIAGNOSIS — L820 Inflamed seborrheic keratosis: Secondary | ICD-10-CM | POA: Diagnosis not present

## 2021-04-06 DIAGNOSIS — Z85828 Personal history of other malignant neoplasm of skin: Secondary | ICD-10-CM | POA: Diagnosis not present

## 2021-04-06 DIAGNOSIS — L718 Other rosacea: Secondary | ICD-10-CM | POA: Diagnosis not present

## 2021-04-06 DIAGNOSIS — L905 Scar conditions and fibrosis of skin: Secondary | ICD-10-CM | POA: Diagnosis not present

## 2021-04-12 DIAGNOSIS — J3081 Allergic rhinitis due to animal (cat) (dog) hair and dander: Secondary | ICD-10-CM | POA: Diagnosis not present

## 2021-04-12 DIAGNOSIS — J3089 Other allergic rhinitis: Secondary | ICD-10-CM | POA: Diagnosis not present

## 2021-04-12 DIAGNOSIS — J301 Allergic rhinitis due to pollen: Secondary | ICD-10-CM | POA: Diagnosis not present

## 2021-04-19 DIAGNOSIS — J3089 Other allergic rhinitis: Secondary | ICD-10-CM | POA: Diagnosis not present

## 2021-04-19 DIAGNOSIS — J301 Allergic rhinitis due to pollen: Secondary | ICD-10-CM | POA: Diagnosis not present

## 2021-04-19 DIAGNOSIS — J3081 Allergic rhinitis due to animal (cat) (dog) hair and dander: Secondary | ICD-10-CM | POA: Diagnosis not present

## 2021-04-28 DIAGNOSIS — J3081 Allergic rhinitis due to animal (cat) (dog) hair and dander: Secondary | ICD-10-CM | POA: Diagnosis not present

## 2021-04-28 DIAGNOSIS — J3089 Other allergic rhinitis: Secondary | ICD-10-CM | POA: Diagnosis not present

## 2021-04-28 DIAGNOSIS — J301 Allergic rhinitis due to pollen: Secondary | ICD-10-CM | POA: Diagnosis not present

## 2021-05-03 DIAGNOSIS — J3081 Allergic rhinitis due to animal (cat) (dog) hair and dander: Secondary | ICD-10-CM | POA: Diagnosis not present

## 2021-05-03 DIAGNOSIS — J301 Allergic rhinitis due to pollen: Secondary | ICD-10-CM | POA: Diagnosis not present

## 2021-05-03 DIAGNOSIS — J3089 Other allergic rhinitis: Secondary | ICD-10-CM | POA: Diagnosis not present

## 2021-05-09 DIAGNOSIS — J3081 Allergic rhinitis due to animal (cat) (dog) hair and dander: Secondary | ICD-10-CM | POA: Diagnosis not present

## 2021-05-09 DIAGNOSIS — J3089 Other allergic rhinitis: Secondary | ICD-10-CM | POA: Diagnosis not present

## 2021-05-09 DIAGNOSIS — J301 Allergic rhinitis due to pollen: Secondary | ICD-10-CM | POA: Diagnosis not present

## 2021-05-16 DIAGNOSIS — J301 Allergic rhinitis due to pollen: Secondary | ICD-10-CM | POA: Diagnosis not present

## 2021-05-16 DIAGNOSIS — J3081 Allergic rhinitis due to animal (cat) (dog) hair and dander: Secondary | ICD-10-CM | POA: Diagnosis not present

## 2021-05-16 DIAGNOSIS — J3089 Other allergic rhinitis: Secondary | ICD-10-CM | POA: Diagnosis not present

## 2021-05-23 DIAGNOSIS — J3081 Allergic rhinitis due to animal (cat) (dog) hair and dander: Secondary | ICD-10-CM | POA: Diagnosis not present

## 2021-05-23 DIAGNOSIS — J3089 Other allergic rhinitis: Secondary | ICD-10-CM | POA: Diagnosis not present

## 2021-05-23 DIAGNOSIS — J301 Allergic rhinitis due to pollen: Secondary | ICD-10-CM | POA: Diagnosis not present

## 2021-05-30 ENCOUNTER — Other Ambulatory Visit: Payer: Self-pay | Admitting: Family Medicine

## 2021-05-30 DIAGNOSIS — J3089 Other allergic rhinitis: Secondary | ICD-10-CM | POA: Diagnosis not present

## 2021-05-30 DIAGNOSIS — J3081 Allergic rhinitis due to animal (cat) (dog) hair and dander: Secondary | ICD-10-CM | POA: Diagnosis not present

## 2021-05-30 DIAGNOSIS — J301 Allergic rhinitis due to pollen: Secondary | ICD-10-CM | POA: Diagnosis not present

## 2021-05-30 DIAGNOSIS — M8580 Other specified disorders of bone density and structure, unspecified site: Secondary | ICD-10-CM

## 2021-06-06 DIAGNOSIS — J3081 Allergic rhinitis due to animal (cat) (dog) hair and dander: Secondary | ICD-10-CM | POA: Diagnosis not present

## 2021-06-06 DIAGNOSIS — J3089 Other allergic rhinitis: Secondary | ICD-10-CM | POA: Diagnosis not present

## 2021-06-06 DIAGNOSIS — J301 Allergic rhinitis due to pollen: Secondary | ICD-10-CM | POA: Diagnosis not present

## 2021-06-15 DIAGNOSIS — J301 Allergic rhinitis due to pollen: Secondary | ICD-10-CM | POA: Diagnosis not present

## 2021-06-15 DIAGNOSIS — J3089 Other allergic rhinitis: Secondary | ICD-10-CM | POA: Diagnosis not present

## 2021-06-15 DIAGNOSIS — J3081 Allergic rhinitis due to animal (cat) (dog) hair and dander: Secondary | ICD-10-CM | POA: Diagnosis not present

## 2021-06-21 DIAGNOSIS — J301 Allergic rhinitis due to pollen: Secondary | ICD-10-CM | POA: Diagnosis not present

## 2021-06-21 DIAGNOSIS — J3081 Allergic rhinitis due to animal (cat) (dog) hair and dander: Secondary | ICD-10-CM | POA: Diagnosis not present

## 2021-06-21 DIAGNOSIS — J3089 Other allergic rhinitis: Secondary | ICD-10-CM | POA: Diagnosis not present

## 2021-07-03 DIAGNOSIS — J3081 Allergic rhinitis due to animal (cat) (dog) hair and dander: Secondary | ICD-10-CM | POA: Diagnosis not present

## 2021-07-03 DIAGNOSIS — J3089 Other allergic rhinitis: Secondary | ICD-10-CM | POA: Diagnosis not present

## 2021-07-03 DIAGNOSIS — Z1231 Encounter for screening mammogram for malignant neoplasm of breast: Secondary | ICD-10-CM | POA: Diagnosis not present

## 2021-07-03 DIAGNOSIS — J301 Allergic rhinitis due to pollen: Secondary | ICD-10-CM | POA: Diagnosis not present

## 2021-07-10 DIAGNOSIS — J301 Allergic rhinitis due to pollen: Secondary | ICD-10-CM | POA: Diagnosis not present

## 2021-07-10 DIAGNOSIS — J3081 Allergic rhinitis due to animal (cat) (dog) hair and dander: Secondary | ICD-10-CM | POA: Diagnosis not present

## 2021-07-10 DIAGNOSIS — J3089 Other allergic rhinitis: Secondary | ICD-10-CM | POA: Diagnosis not present

## 2021-07-13 DIAGNOSIS — J4540 Moderate persistent asthma, uncomplicated: Secondary | ICD-10-CM | POA: Diagnosis not present

## 2021-07-13 DIAGNOSIS — J3081 Allergic rhinitis due to animal (cat) (dog) hair and dander: Secondary | ICD-10-CM | POA: Diagnosis not present

## 2021-07-13 DIAGNOSIS — J3089 Other allergic rhinitis: Secondary | ICD-10-CM | POA: Diagnosis not present

## 2021-07-13 DIAGNOSIS — J301 Allergic rhinitis due to pollen: Secondary | ICD-10-CM | POA: Diagnosis not present

## 2021-07-20 DIAGNOSIS — J3089 Other allergic rhinitis: Secondary | ICD-10-CM | POA: Diagnosis not present

## 2021-07-20 DIAGNOSIS — J301 Allergic rhinitis due to pollen: Secondary | ICD-10-CM | POA: Diagnosis not present

## 2021-07-20 DIAGNOSIS — J3081 Allergic rhinitis due to animal (cat) (dog) hair and dander: Secondary | ICD-10-CM | POA: Diagnosis not present

## 2021-07-24 DIAGNOSIS — J3089 Other allergic rhinitis: Secondary | ICD-10-CM | POA: Diagnosis not present

## 2021-07-24 DIAGNOSIS — J301 Allergic rhinitis due to pollen: Secondary | ICD-10-CM | POA: Diagnosis not present

## 2021-07-24 DIAGNOSIS — J3081 Allergic rhinitis due to animal (cat) (dog) hair and dander: Secondary | ICD-10-CM | POA: Diagnosis not present

## 2021-08-01 DIAGNOSIS — J3081 Allergic rhinitis due to animal (cat) (dog) hair and dander: Secondary | ICD-10-CM | POA: Diagnosis not present

## 2021-08-01 DIAGNOSIS — J301 Allergic rhinitis due to pollen: Secondary | ICD-10-CM | POA: Diagnosis not present

## 2021-08-01 DIAGNOSIS — J3089 Other allergic rhinitis: Secondary | ICD-10-CM | POA: Diagnosis not present

## 2021-08-08 DIAGNOSIS — J301 Allergic rhinitis due to pollen: Secondary | ICD-10-CM | POA: Diagnosis not present

## 2021-08-08 DIAGNOSIS — J3089 Other allergic rhinitis: Secondary | ICD-10-CM | POA: Diagnosis not present

## 2021-08-08 DIAGNOSIS — J3081 Allergic rhinitis due to animal (cat) (dog) hair and dander: Secondary | ICD-10-CM | POA: Diagnosis not present

## 2021-08-14 DIAGNOSIS — J3089 Other allergic rhinitis: Secondary | ICD-10-CM | POA: Diagnosis not present

## 2021-08-14 DIAGNOSIS — J301 Allergic rhinitis due to pollen: Secondary | ICD-10-CM | POA: Diagnosis not present

## 2021-08-14 DIAGNOSIS — J3081 Allergic rhinitis due to animal (cat) (dog) hair and dander: Secondary | ICD-10-CM | POA: Diagnosis not present

## 2021-08-21 DIAGNOSIS — J301 Allergic rhinitis due to pollen: Secondary | ICD-10-CM | POA: Diagnosis not present

## 2021-08-21 DIAGNOSIS — J3089 Other allergic rhinitis: Secondary | ICD-10-CM | POA: Diagnosis not present

## 2021-08-21 DIAGNOSIS — J3081 Allergic rhinitis due to animal (cat) (dog) hair and dander: Secondary | ICD-10-CM | POA: Diagnosis not present

## 2021-08-30 DIAGNOSIS — J3089 Other allergic rhinitis: Secondary | ICD-10-CM | POA: Diagnosis not present

## 2021-08-30 DIAGNOSIS — J3081 Allergic rhinitis due to animal (cat) (dog) hair and dander: Secondary | ICD-10-CM | POA: Diagnosis not present

## 2021-08-30 DIAGNOSIS — J301 Allergic rhinitis due to pollen: Secondary | ICD-10-CM | POA: Diagnosis not present

## 2021-09-05 DIAGNOSIS — J3089 Other allergic rhinitis: Secondary | ICD-10-CM | POA: Diagnosis not present

## 2021-09-05 DIAGNOSIS — J3081 Allergic rhinitis due to animal (cat) (dog) hair and dander: Secondary | ICD-10-CM | POA: Diagnosis not present

## 2021-09-05 DIAGNOSIS — J301 Allergic rhinitis due to pollen: Secondary | ICD-10-CM | POA: Diagnosis not present

## 2021-09-06 DIAGNOSIS — J3089 Other allergic rhinitis: Secondary | ICD-10-CM | POA: Diagnosis not present

## 2021-09-06 DIAGNOSIS — J301 Allergic rhinitis due to pollen: Secondary | ICD-10-CM | POA: Diagnosis not present

## 2021-09-06 DIAGNOSIS — J3081 Allergic rhinitis due to animal (cat) (dog) hair and dander: Secondary | ICD-10-CM | POA: Diagnosis not present

## 2021-09-10 DIAGNOSIS — J45909 Unspecified asthma, uncomplicated: Secondary | ICD-10-CM | POA: Diagnosis not present

## 2021-09-10 DIAGNOSIS — J3089 Other allergic rhinitis: Secondary | ICD-10-CM | POA: Diagnosis not present

## 2021-10-03 DIAGNOSIS — J301 Allergic rhinitis due to pollen: Secondary | ICD-10-CM | POA: Diagnosis not present

## 2021-10-03 DIAGNOSIS — J3081 Allergic rhinitis due to animal (cat) (dog) hair and dander: Secondary | ICD-10-CM | POA: Diagnosis not present

## 2021-10-03 DIAGNOSIS — J3089 Other allergic rhinitis: Secondary | ICD-10-CM | POA: Diagnosis not present

## 2021-10-05 DIAGNOSIS — Z808 Family history of malignant neoplasm of other organs or systems: Secondary | ICD-10-CM | POA: Diagnosis not present

## 2021-10-05 DIAGNOSIS — Z7951 Long term (current) use of inhaled steroids: Secondary | ICD-10-CM | POA: Diagnosis not present

## 2021-10-05 DIAGNOSIS — N951 Menopausal and female climacteric states: Secondary | ICD-10-CM | POA: Diagnosis not present

## 2021-10-05 DIAGNOSIS — E785 Hyperlipidemia, unspecified: Secondary | ICD-10-CM | POA: Diagnosis not present

## 2021-10-05 DIAGNOSIS — Z79899 Other long term (current) drug therapy: Secondary | ICD-10-CM | POA: Diagnosis not present

## 2021-10-05 DIAGNOSIS — J45909 Unspecified asthma, uncomplicated: Secondary | ICD-10-CM | POA: Diagnosis not present

## 2021-10-05 DIAGNOSIS — E669 Obesity, unspecified: Secondary | ICD-10-CM | POA: Diagnosis not present

## 2021-10-05 DIAGNOSIS — Z803 Family history of malignant neoplasm of breast: Secondary | ICD-10-CM | POA: Diagnosis not present

## 2021-10-05 DIAGNOSIS — Z8249 Family history of ischemic heart disease and other diseases of the circulatory system: Secondary | ICD-10-CM | POA: Diagnosis not present

## 2021-10-17 DIAGNOSIS — J301 Allergic rhinitis due to pollen: Secondary | ICD-10-CM | POA: Diagnosis not present

## 2021-10-17 DIAGNOSIS — J3089 Other allergic rhinitis: Secondary | ICD-10-CM | POA: Diagnosis not present

## 2021-10-17 DIAGNOSIS — J3081 Allergic rhinitis due to animal (cat) (dog) hair and dander: Secondary | ICD-10-CM | POA: Diagnosis not present

## 2021-10-24 DIAGNOSIS — J3081 Allergic rhinitis due to animal (cat) (dog) hair and dander: Secondary | ICD-10-CM | POA: Diagnosis not present

## 2021-10-24 DIAGNOSIS — J301 Allergic rhinitis due to pollen: Secondary | ICD-10-CM | POA: Diagnosis not present

## 2021-10-24 DIAGNOSIS — J3089 Other allergic rhinitis: Secondary | ICD-10-CM | POA: Diagnosis not present

## 2021-10-30 DIAGNOSIS — J3081 Allergic rhinitis due to animal (cat) (dog) hair and dander: Secondary | ICD-10-CM | POA: Diagnosis not present

## 2021-10-30 DIAGNOSIS — J3089 Other allergic rhinitis: Secondary | ICD-10-CM | POA: Diagnosis not present

## 2021-10-30 DIAGNOSIS — J301 Allergic rhinitis due to pollen: Secondary | ICD-10-CM | POA: Diagnosis not present

## 2021-11-10 DIAGNOSIS — J3089 Other allergic rhinitis: Secondary | ICD-10-CM | POA: Diagnosis not present

## 2021-11-10 DIAGNOSIS — J3081 Allergic rhinitis due to animal (cat) (dog) hair and dander: Secondary | ICD-10-CM | POA: Diagnosis not present

## 2021-11-10 DIAGNOSIS — J301 Allergic rhinitis due to pollen: Secondary | ICD-10-CM | POA: Diagnosis not present

## 2021-11-17 ENCOUNTER — Other Ambulatory Visit: Payer: Medicare PPO

## 2021-11-22 ENCOUNTER — Ambulatory Visit
Admission: RE | Admit: 2021-11-22 | Discharge: 2021-11-22 | Disposition: A | Discharge status: Home / Self Care | Payer: Medicare PPO | Source: Ambulatory Visit | Attending: Family Medicine | Admitting: Family Medicine

## 2021-11-22 DIAGNOSIS — Z780 Asymptomatic menopausal state: Secondary | ICD-10-CM | POA: Diagnosis not present

## 2021-11-22 DIAGNOSIS — M8580 Other specified disorders of bone density and structure, unspecified site: Secondary | ICD-10-CM

## 2021-11-22 DIAGNOSIS — M8589 Other specified disorders of bone density and structure, multiple sites: Secondary | ICD-10-CM | POA: Diagnosis not present

## 2021-11-23 DIAGNOSIS — J45901 Unspecified asthma with (acute) exacerbation: Secondary | ICD-10-CM | POA: Diagnosis not present

## 2021-11-23 DIAGNOSIS — T7840XA Allergy, unspecified, initial encounter: Secondary | ICD-10-CM | POA: Diagnosis not present

## 2021-11-23 DIAGNOSIS — J4530 Mild persistent asthma, uncomplicated: Secondary | ICD-10-CM | POA: Diagnosis not present

## 2021-11-29 DIAGNOSIS — J301 Allergic rhinitis due to pollen: Secondary | ICD-10-CM | POA: Diagnosis not present

## 2021-11-29 DIAGNOSIS — J3089 Other allergic rhinitis: Secondary | ICD-10-CM | POA: Diagnosis not present

## 2021-11-29 DIAGNOSIS — J3081 Allergic rhinitis due to animal (cat) (dog) hair and dander: Secondary | ICD-10-CM | POA: Diagnosis not present

## 2021-12-04 DIAGNOSIS — J3089 Other allergic rhinitis: Secondary | ICD-10-CM | POA: Diagnosis not present

## 2021-12-04 DIAGNOSIS — J301 Allergic rhinitis due to pollen: Secondary | ICD-10-CM | POA: Diagnosis not present

## 2021-12-04 DIAGNOSIS — J3081 Allergic rhinitis due to animal (cat) (dog) hair and dander: Secondary | ICD-10-CM | POA: Diagnosis not present

## 2021-12-15 DIAGNOSIS — J3081 Allergic rhinitis due to animal (cat) (dog) hair and dander: Secondary | ICD-10-CM | POA: Diagnosis not present

## 2021-12-15 DIAGNOSIS — J3089 Other allergic rhinitis: Secondary | ICD-10-CM | POA: Diagnosis not present

## 2021-12-15 DIAGNOSIS — J301 Allergic rhinitis due to pollen: Secondary | ICD-10-CM | POA: Diagnosis not present

## 2021-12-21 DIAGNOSIS — J301 Allergic rhinitis due to pollen: Secondary | ICD-10-CM | POA: Diagnosis not present

## 2021-12-21 DIAGNOSIS — J3081 Allergic rhinitis due to animal (cat) (dog) hair and dander: Secondary | ICD-10-CM | POA: Diagnosis not present

## 2021-12-21 DIAGNOSIS — J3089 Other allergic rhinitis: Secondary | ICD-10-CM | POA: Diagnosis not present

## 2021-12-29 DIAGNOSIS — J301 Allergic rhinitis due to pollen: Secondary | ICD-10-CM | POA: Diagnosis not present

## 2021-12-29 DIAGNOSIS — J3081 Allergic rhinitis due to animal (cat) (dog) hair and dander: Secondary | ICD-10-CM | POA: Diagnosis not present

## 2021-12-29 DIAGNOSIS — J3089 Other allergic rhinitis: Secondary | ICD-10-CM | POA: Diagnosis not present

## 2022-01-09 DIAGNOSIS — J3081 Allergic rhinitis due to animal (cat) (dog) hair and dander: Secondary | ICD-10-CM | POA: Diagnosis not present

## 2022-01-09 DIAGNOSIS — J3089 Other allergic rhinitis: Secondary | ICD-10-CM | POA: Diagnosis not present

## 2022-01-09 DIAGNOSIS — J301 Allergic rhinitis due to pollen: Secondary | ICD-10-CM | POA: Diagnosis not present

## 2022-01-18 DIAGNOSIS — E7800 Pure hypercholesterolemia, unspecified: Secondary | ICD-10-CM | POA: Diagnosis not present

## 2022-01-18 DIAGNOSIS — M8589 Other specified disorders of bone density and structure, multiple sites: Secondary | ICD-10-CM | POA: Diagnosis not present

## 2022-01-18 DIAGNOSIS — Z79899 Other long term (current) drug therapy: Secondary | ICD-10-CM | POA: Diagnosis not present

## 2022-01-18 DIAGNOSIS — Z0000 Encounter for general adult medical examination without abnormal findings: Secondary | ICD-10-CM | POA: Diagnosis not present

## 2022-01-18 DIAGNOSIS — J3081 Allergic rhinitis due to animal (cat) (dog) hair and dander: Secondary | ICD-10-CM | POA: Diagnosis not present

## 2022-01-18 DIAGNOSIS — J4530 Mild persistent asthma, uncomplicated: Secondary | ICD-10-CM | POA: Diagnosis not present

## 2022-01-18 DIAGNOSIS — J301 Allergic rhinitis due to pollen: Secondary | ICD-10-CM | POA: Diagnosis not present

## 2022-01-18 DIAGNOSIS — J3089 Other allergic rhinitis: Secondary | ICD-10-CM | POA: Diagnosis not present

## 2022-01-18 DIAGNOSIS — M25551 Pain in right hip: Secondary | ICD-10-CM | POA: Diagnosis not present

## 2022-01-18 DIAGNOSIS — E559 Vitamin D deficiency, unspecified: Secondary | ICD-10-CM | POA: Diagnosis not present

## 2022-01-23 DIAGNOSIS — J3089 Other allergic rhinitis: Secondary | ICD-10-CM | POA: Diagnosis not present

## 2022-01-23 DIAGNOSIS — J3081 Allergic rhinitis due to animal (cat) (dog) hair and dander: Secondary | ICD-10-CM | POA: Diagnosis not present

## 2022-01-23 DIAGNOSIS — J301 Allergic rhinitis due to pollen: Secondary | ICD-10-CM | POA: Diagnosis not present

## 2022-02-02 DIAGNOSIS — J3081 Allergic rhinitis due to animal (cat) (dog) hair and dander: Secondary | ICD-10-CM | POA: Diagnosis not present

## 2022-02-02 DIAGNOSIS — J3089 Other allergic rhinitis: Secondary | ICD-10-CM | POA: Diagnosis not present

## 2022-02-02 DIAGNOSIS — J301 Allergic rhinitis due to pollen: Secondary | ICD-10-CM | POA: Diagnosis not present

## 2022-02-11 IMAGING — CT CT ANGIO CHEST
2 of 6 series · 19 of 46 positions shown · IV contrast (APPLIED)
Comparison: Chest x-ray 02/14/2019

CLINICAL DATA: Syncope with elevated D-dimer

EXAM:
CT ANGIOGRAPHY CHEST WITH CONTRAST
TECHNIQUE: Multidetector CT imaging of the chest was performed using the
standard protocol during bolus administration of intravenous
contrast. Multiplanar CT image reconstructions and MIPs were
obtained to evaluate the vascular anatomy.
CONTRAST:  100mL OMNIPAQUE IOHEXOL 350 MG/ML SOLN

[Series 5: thins · axial · 0.69mm/px · z∈[-320,-66]mm · 17 of 280 slices shown]
[im 13/280  lung]
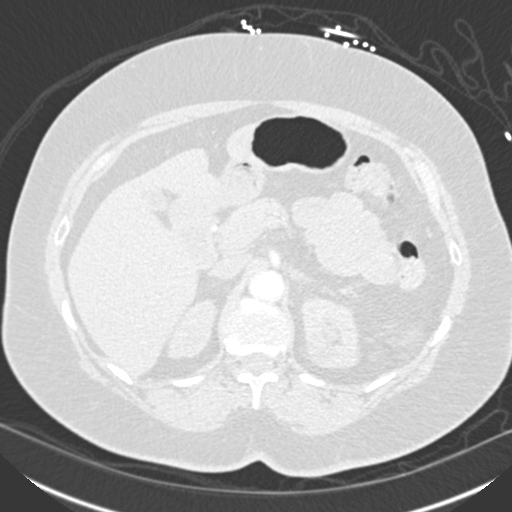
[im 25/280  soft-tissue]
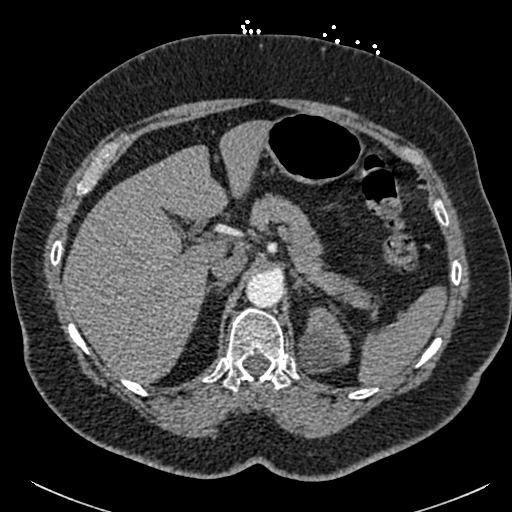
[im 49/280  lung]
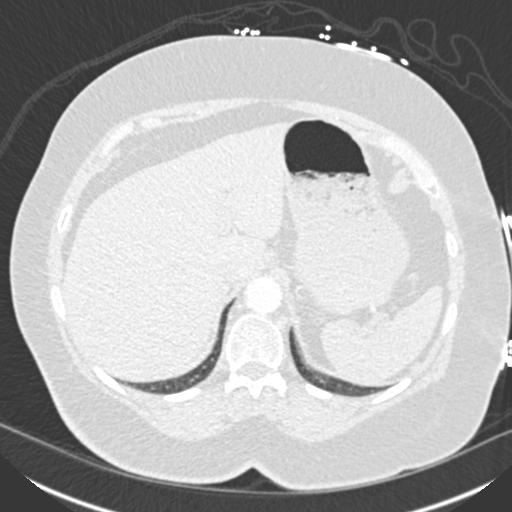
[im 61/280  soft-tissue]
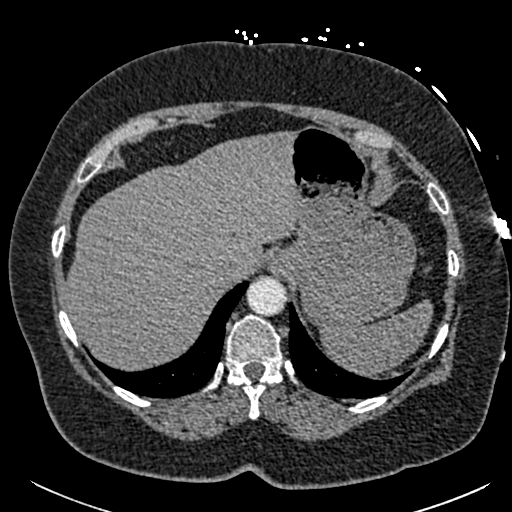
[im 73/280  lung]
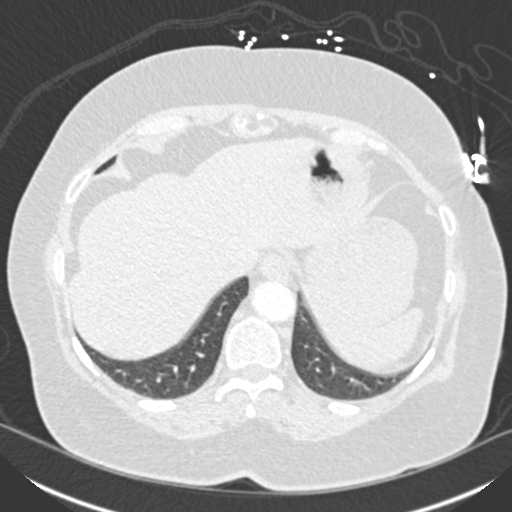
[im 98/280  soft-tissue]
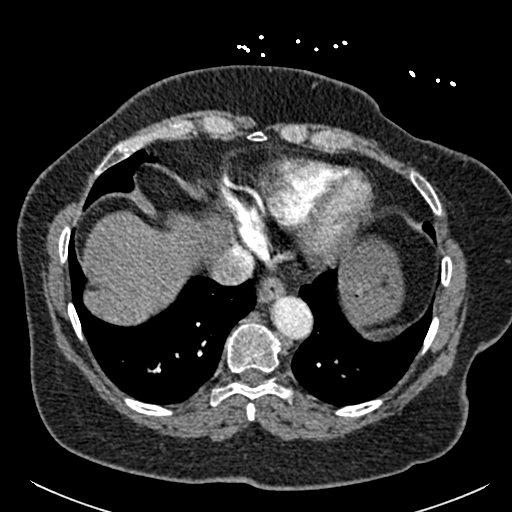
[im 110/280  lung]
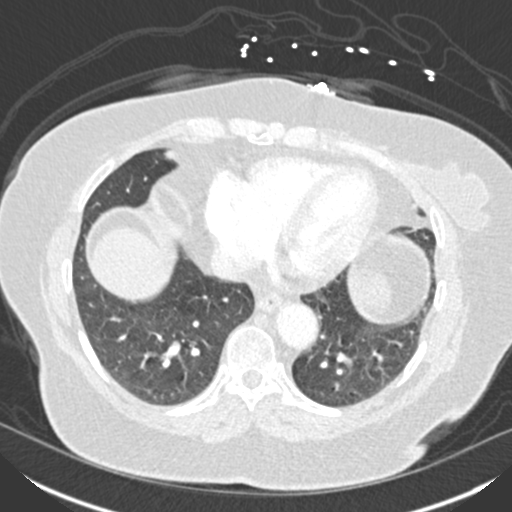
[im 122/280  soft-tissue]
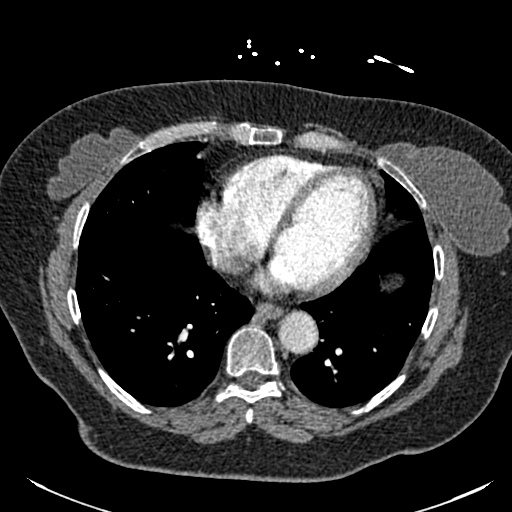
[im 146/280  lung]
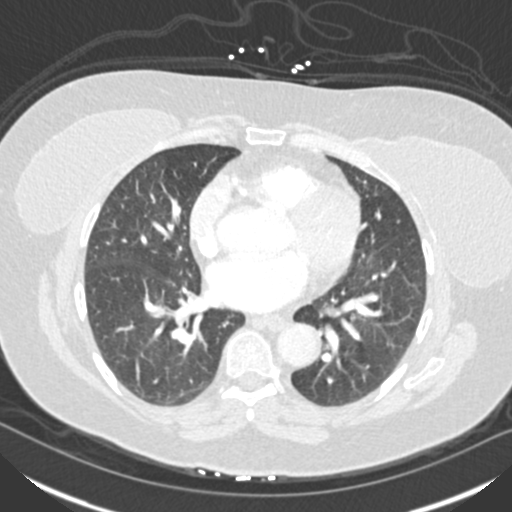
[im 158/280  soft-tissue]
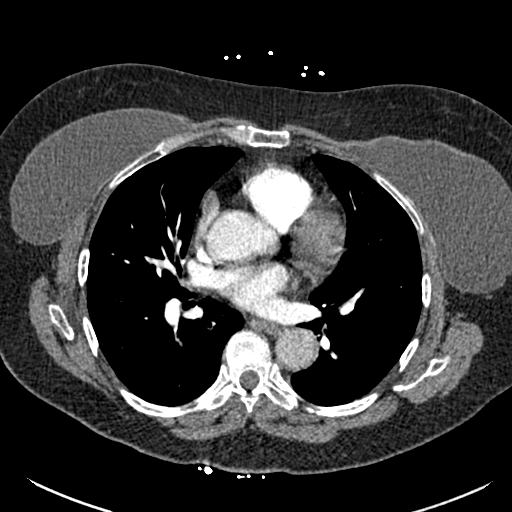
[im 170/280  lung]
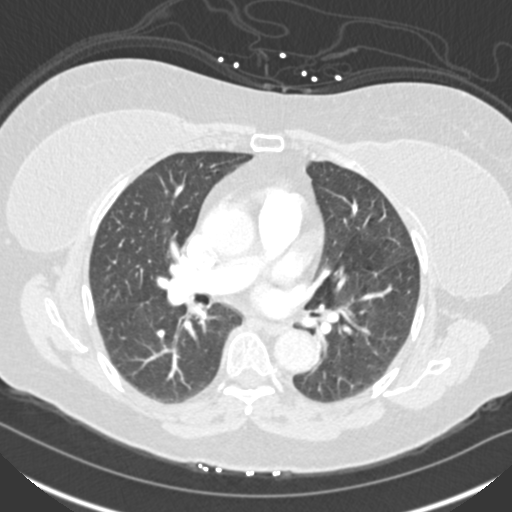
[im 182/280  soft-tissue]
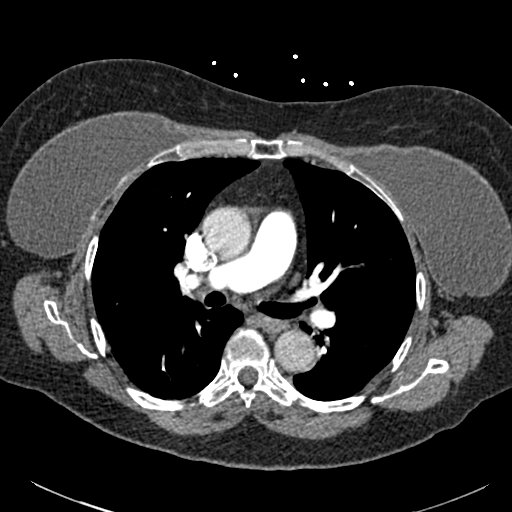
[im 207/280  lung]
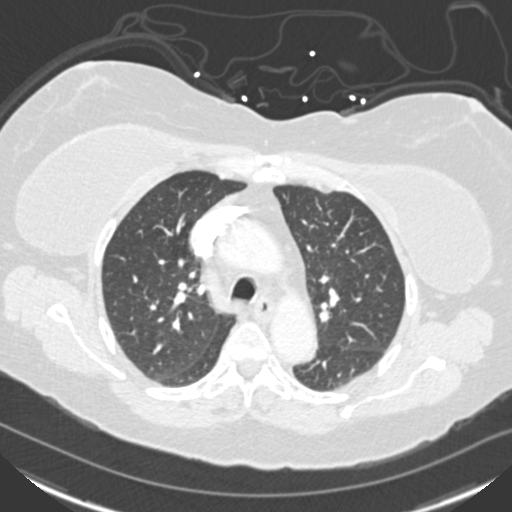
[im 219/280  soft-tissue]
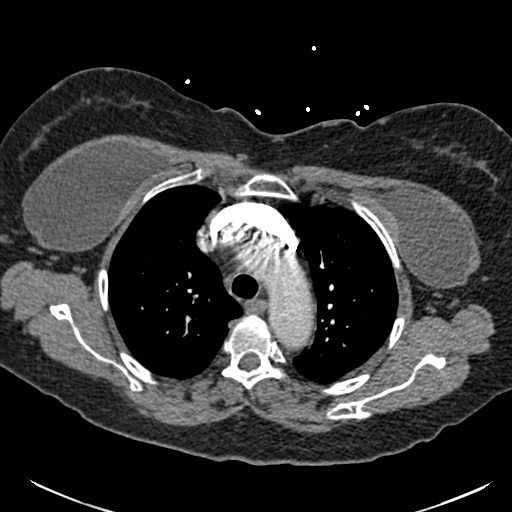
[im 231/280  lung]
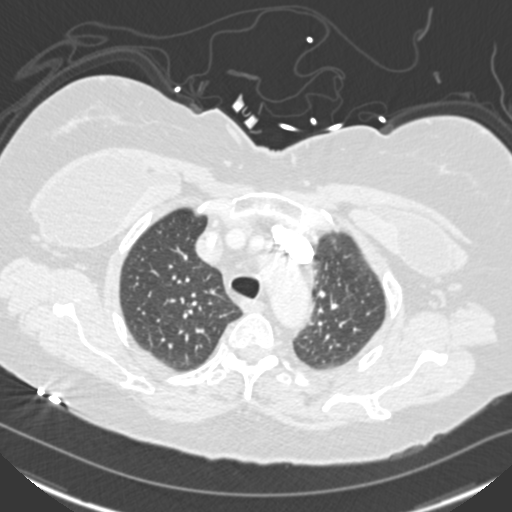
[im 255/280  soft-tissue]
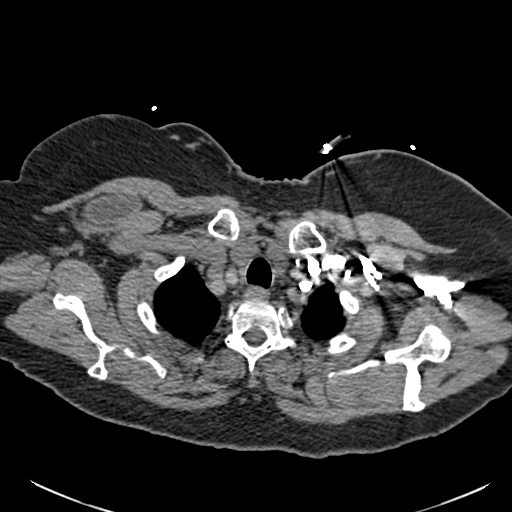
[im 267/280  lung]
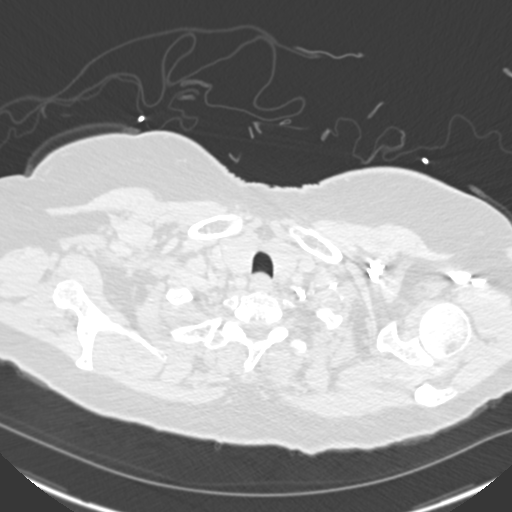

[Series 7: coronal mpr · coronal · 0.55mm/px · 2 of 92 slices shown]
[im 31/92  soft-tissue]
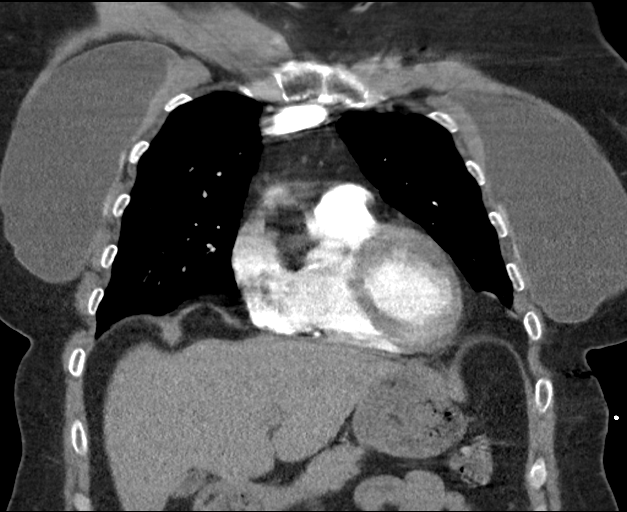
[im 61/92  soft-tissue]
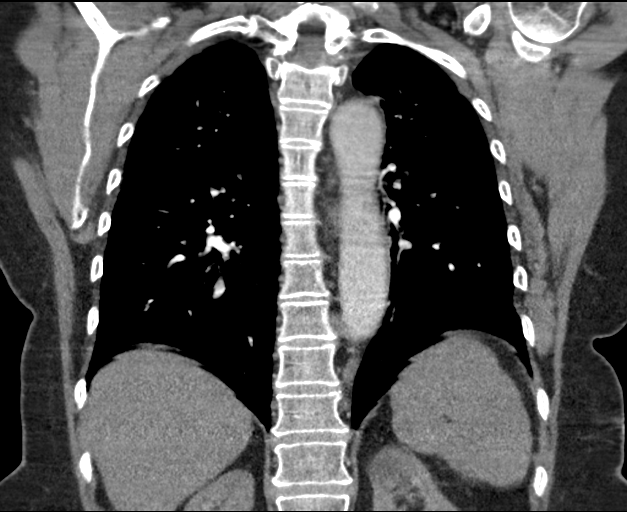

[19 of 46 positions shown; findings below may reference images not displayed]

FINDINGS: Cardiovascular: Satisfactory opacification of the pulmonary arteries
to the segmental level. No evidence of pulmonary embolism.
Nonaneurysmal aorta.

Mediastinum/Nodes: No enlarged mediastinal, hilar, or axillary lymph
nodes. Thyroid gland, trachea, and esophagus demonstrate no
significant findings.

Lungs/Pleura: Lungs are clear. No pleural effusion or pneumothorax.

Upper Abdomen: No acute abnormality.

Musculoskeletal: Bilateral breast prostheses. No acute osseous
abnormality.

Review of the MIP images confirms the above findings.
IMPRESSION: 1. No CT evidence for acute pulmonary embolus.
2. Clear lung fields

## 2022-02-14 DIAGNOSIS — J301 Allergic rhinitis due to pollen: Secondary | ICD-10-CM | POA: Diagnosis not present

## 2022-02-14 DIAGNOSIS — J3089 Other allergic rhinitis: Secondary | ICD-10-CM | POA: Diagnosis not present

## 2022-02-14 DIAGNOSIS — J3081 Allergic rhinitis due to animal (cat) (dog) hair and dander: Secondary | ICD-10-CM | POA: Diagnosis not present

## 2022-02-19 DIAGNOSIS — J3081 Allergic rhinitis due to animal (cat) (dog) hair and dander: Secondary | ICD-10-CM | POA: Diagnosis not present

## 2022-02-19 DIAGNOSIS — J3089 Other allergic rhinitis: Secondary | ICD-10-CM | POA: Diagnosis not present

## 2022-02-19 DIAGNOSIS — J301 Allergic rhinitis due to pollen: Secondary | ICD-10-CM | POA: Diagnosis not present

## 2022-02-28 DIAGNOSIS — J3089 Other allergic rhinitis: Secondary | ICD-10-CM | POA: Diagnosis not present

## 2022-02-28 DIAGNOSIS — J3081 Allergic rhinitis due to animal (cat) (dog) hair and dander: Secondary | ICD-10-CM | POA: Diagnosis not present

## 2022-02-28 DIAGNOSIS — J301 Allergic rhinitis due to pollen: Secondary | ICD-10-CM | POA: Diagnosis not present

## 2022-03-05 DIAGNOSIS — J301 Allergic rhinitis due to pollen: Secondary | ICD-10-CM | POA: Diagnosis not present

## 2022-03-05 DIAGNOSIS — J3081 Allergic rhinitis due to animal (cat) (dog) hair and dander: Secondary | ICD-10-CM | POA: Diagnosis not present

## 2022-03-05 DIAGNOSIS — J3089 Other allergic rhinitis: Secondary | ICD-10-CM | POA: Diagnosis not present

## 2022-03-26 DIAGNOSIS — J301 Allergic rhinitis due to pollen: Secondary | ICD-10-CM | POA: Diagnosis not present

## 2022-03-26 DIAGNOSIS — J3081 Allergic rhinitis due to animal (cat) (dog) hair and dander: Secondary | ICD-10-CM | POA: Diagnosis not present

## 2022-03-26 DIAGNOSIS — J3089 Other allergic rhinitis: Secondary | ICD-10-CM | POA: Diagnosis not present

## 2022-04-04 DIAGNOSIS — J3089 Other allergic rhinitis: Secondary | ICD-10-CM | POA: Diagnosis not present

## 2022-04-04 DIAGNOSIS — J301 Allergic rhinitis due to pollen: Secondary | ICD-10-CM | POA: Diagnosis not present

## 2022-04-04 DIAGNOSIS — J3081 Allergic rhinitis due to animal (cat) (dog) hair and dander: Secondary | ICD-10-CM | POA: Diagnosis not present

## 2022-04-12 DIAGNOSIS — J3081 Allergic rhinitis due to animal (cat) (dog) hair and dander: Secondary | ICD-10-CM | POA: Diagnosis not present

## 2022-04-12 DIAGNOSIS — J301 Allergic rhinitis due to pollen: Secondary | ICD-10-CM | POA: Diagnosis not present

## 2022-04-12 DIAGNOSIS — J3089 Other allergic rhinitis: Secondary | ICD-10-CM | POA: Diagnosis not present

## 2022-04-18 DIAGNOSIS — J3081 Allergic rhinitis due to animal (cat) (dog) hair and dander: Secondary | ICD-10-CM | POA: Diagnosis not present

## 2022-04-18 DIAGNOSIS — J301 Allergic rhinitis due to pollen: Secondary | ICD-10-CM | POA: Diagnosis not present

## 2022-04-18 DIAGNOSIS — J3089 Other allergic rhinitis: Secondary | ICD-10-CM | POA: Diagnosis not present

## 2022-04-25 DIAGNOSIS — J3081 Allergic rhinitis due to animal (cat) (dog) hair and dander: Secondary | ICD-10-CM | POA: Diagnosis not present

## 2022-04-25 DIAGNOSIS — J3089 Other allergic rhinitis: Secondary | ICD-10-CM | POA: Diagnosis not present

## 2022-04-25 DIAGNOSIS — J301 Allergic rhinitis due to pollen: Secondary | ICD-10-CM | POA: Diagnosis not present

## 2022-04-26 IMAGING — DX DG CHEST 2V
2 series · 2 of 2 positions shown · non-contrast
Comparison: Chest x-ray 03/04/2019.

CLINICAL DATA: 64-year-old female with history of wheezing in the
right lower lung.

EXAM:
CHEST - 2 VIEW

[chest pa]
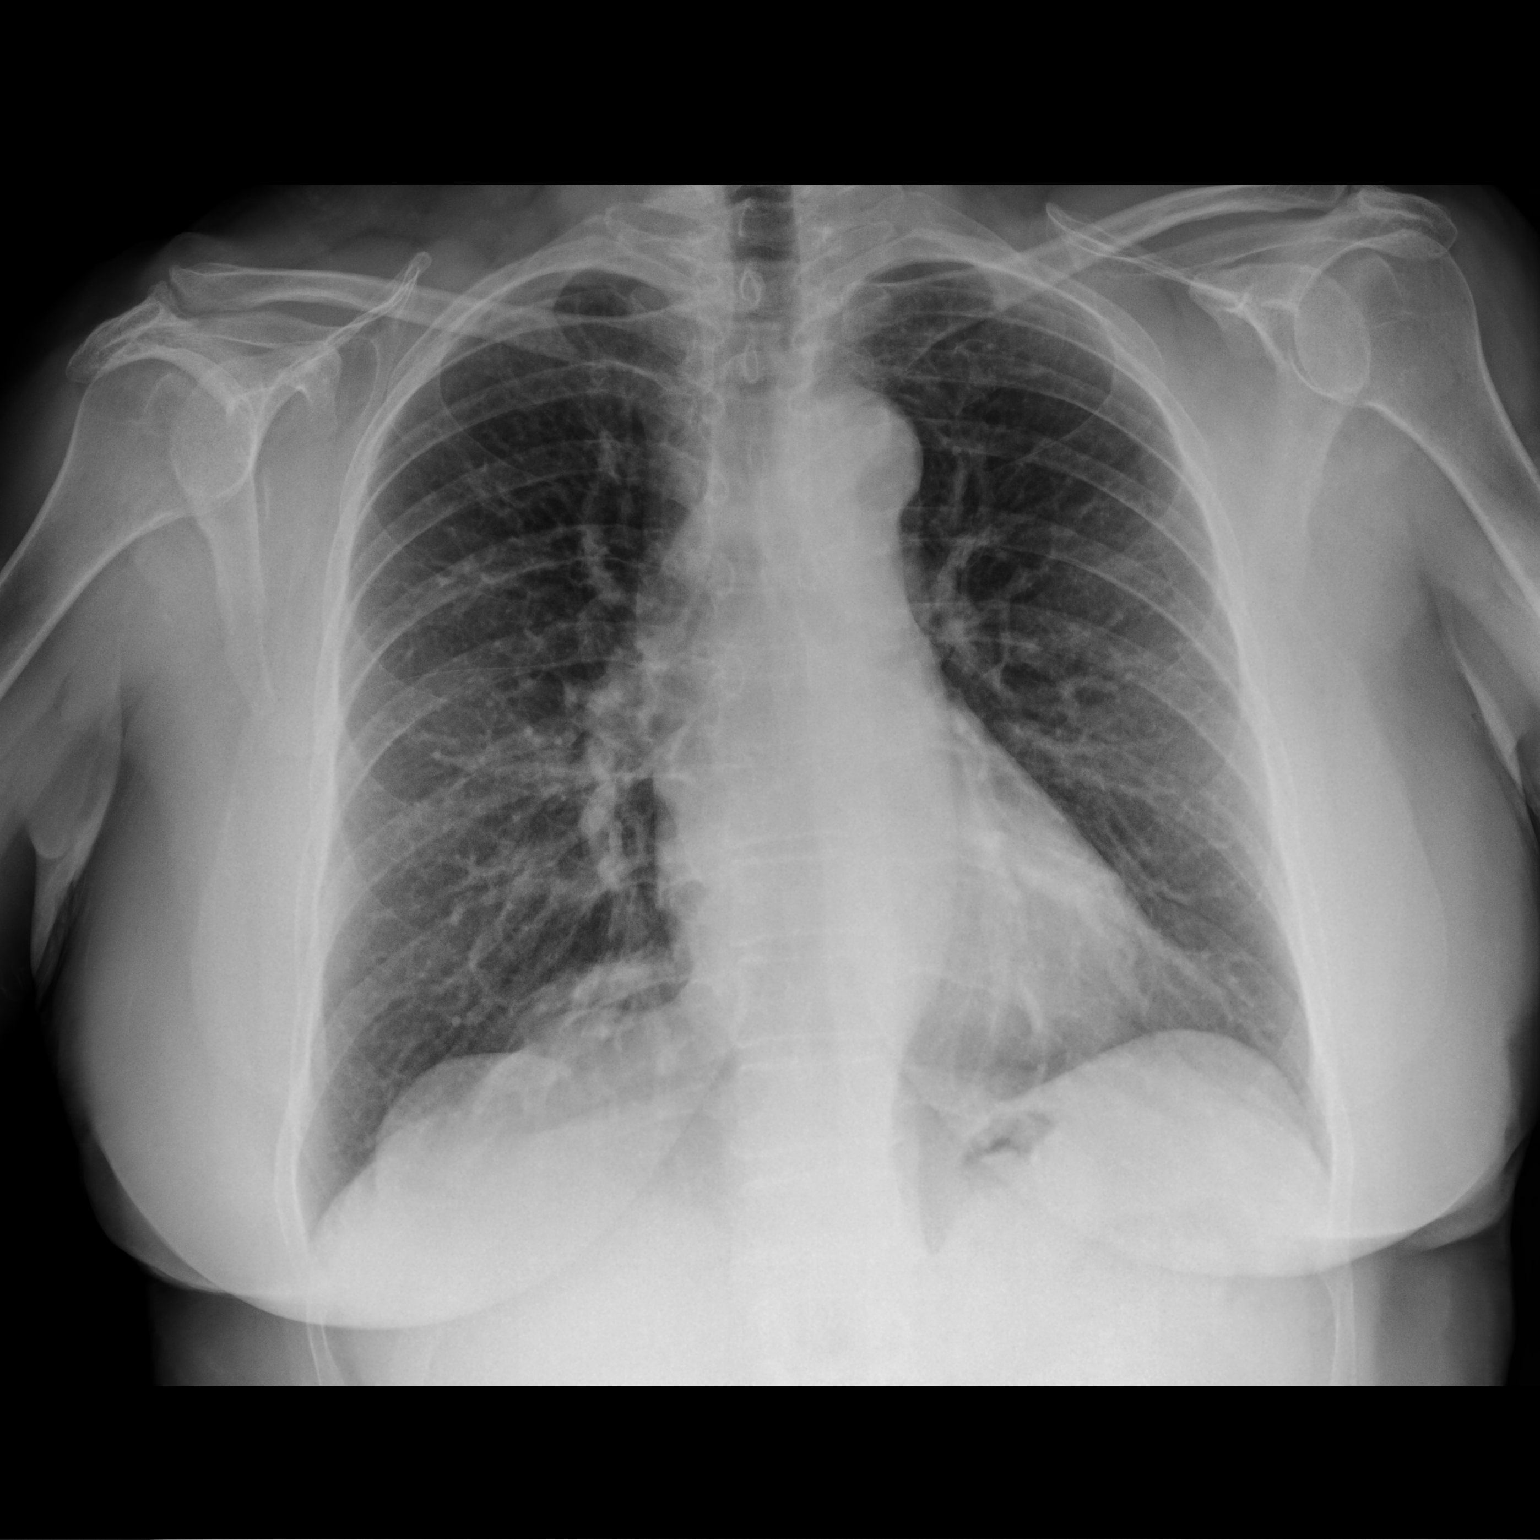

[chest lat]
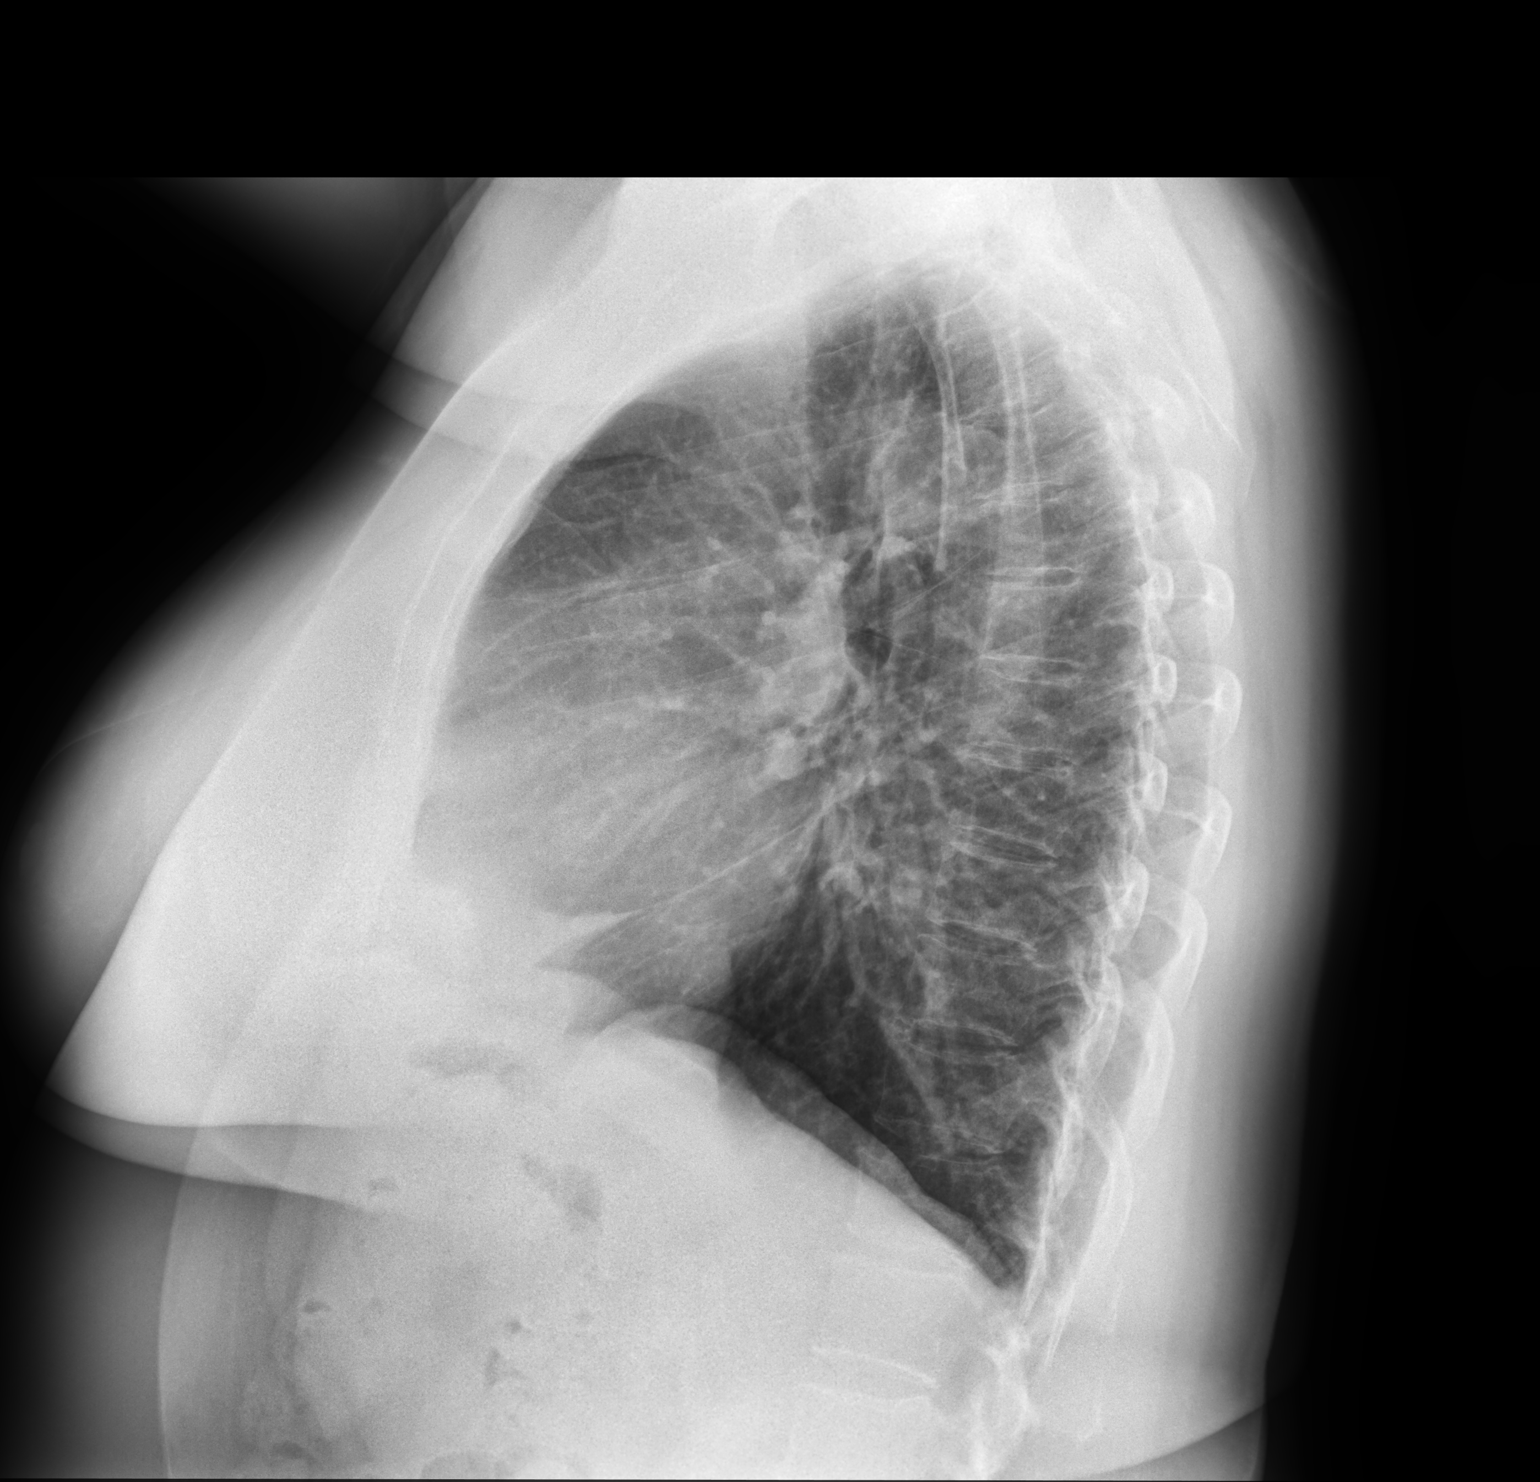

[2 of 2 positions shown; findings below may reference images not displayed]

FINDINGS: Lung volumes are normal. No consolidative airspace disease. No
pleural effusions. No pneumothorax. No pulmonary nodule or mass
noted. Pulmonary vasculature and the cardiomediastinal silhouette
are within normal limits. Atherosclerosis in the thoracic aorta.
IMPRESSION: 1.  No radiographic evidence of acute cardiopulmonary disease.
2. Aortic atherosclerosis.

## 2022-04-30 DIAGNOSIS — J3089 Other allergic rhinitis: Secondary | ICD-10-CM | POA: Diagnosis not present

## 2022-04-30 DIAGNOSIS — J3081 Allergic rhinitis due to animal (cat) (dog) hair and dander: Secondary | ICD-10-CM | POA: Diagnosis not present

## 2022-04-30 DIAGNOSIS — J301 Allergic rhinitis due to pollen: Secondary | ICD-10-CM | POA: Diagnosis not present

## 2022-05-07 DIAGNOSIS — J3089 Other allergic rhinitis: Secondary | ICD-10-CM | POA: Diagnosis not present

## 2022-05-07 DIAGNOSIS — J301 Allergic rhinitis due to pollen: Secondary | ICD-10-CM | POA: Diagnosis not present

## 2022-05-07 DIAGNOSIS — J3081 Allergic rhinitis due to animal (cat) (dog) hair and dander: Secondary | ICD-10-CM | POA: Diagnosis not present

## 2022-05-08 DIAGNOSIS — J3081 Allergic rhinitis due to animal (cat) (dog) hair and dander: Secondary | ICD-10-CM | POA: Diagnosis not present

## 2022-05-08 DIAGNOSIS — J3089 Other allergic rhinitis: Secondary | ICD-10-CM | POA: Diagnosis not present

## 2022-05-08 DIAGNOSIS — J301 Allergic rhinitis due to pollen: Secondary | ICD-10-CM | POA: Diagnosis not present

## 2022-05-21 DIAGNOSIS — J3089 Other allergic rhinitis: Secondary | ICD-10-CM | POA: Diagnosis not present

## 2022-05-21 DIAGNOSIS — J301 Allergic rhinitis due to pollen: Secondary | ICD-10-CM | POA: Diagnosis not present

## 2022-05-21 DIAGNOSIS — J3081 Allergic rhinitis due to animal (cat) (dog) hair and dander: Secondary | ICD-10-CM | POA: Diagnosis not present

## 2022-05-28 DIAGNOSIS — J3081 Allergic rhinitis due to animal (cat) (dog) hair and dander: Secondary | ICD-10-CM | POA: Diagnosis not present

## 2022-05-28 DIAGNOSIS — J3089 Other allergic rhinitis: Secondary | ICD-10-CM | POA: Diagnosis not present

## 2022-05-28 DIAGNOSIS — J301 Allergic rhinitis due to pollen: Secondary | ICD-10-CM | POA: Diagnosis not present

## 2022-05-30 DIAGNOSIS — Z08 Encounter for follow-up examination after completed treatment for malignant neoplasm: Secondary | ICD-10-CM | POA: Diagnosis not present

## 2022-05-30 DIAGNOSIS — D225 Melanocytic nevi of trunk: Secondary | ICD-10-CM | POA: Diagnosis not present

## 2022-05-30 DIAGNOSIS — L821 Other seborrheic keratosis: Secondary | ICD-10-CM | POA: Diagnosis not present

## 2022-05-30 DIAGNOSIS — Z85828 Personal history of other malignant neoplasm of skin: Secondary | ICD-10-CM | POA: Diagnosis not present

## 2022-05-30 DIAGNOSIS — L814 Other melanin hyperpigmentation: Secondary | ICD-10-CM | POA: Diagnosis not present

## 2022-06-04 DIAGNOSIS — J3089 Other allergic rhinitis: Secondary | ICD-10-CM | POA: Diagnosis not present

## 2022-06-04 DIAGNOSIS — J301 Allergic rhinitis due to pollen: Secondary | ICD-10-CM | POA: Diagnosis not present

## 2022-06-04 DIAGNOSIS — J3081 Allergic rhinitis due to animal (cat) (dog) hair and dander: Secondary | ICD-10-CM | POA: Diagnosis not present

## 2022-06-11 DIAGNOSIS — J3081 Allergic rhinitis due to animal (cat) (dog) hair and dander: Secondary | ICD-10-CM | POA: Diagnosis not present

## 2022-06-11 DIAGNOSIS — J301 Allergic rhinitis due to pollen: Secondary | ICD-10-CM | POA: Diagnosis not present

## 2022-06-11 DIAGNOSIS — J3089 Other allergic rhinitis: Secondary | ICD-10-CM | POA: Diagnosis not present

## 2022-06-26 DIAGNOSIS — J3081 Allergic rhinitis due to animal (cat) (dog) hair and dander: Secondary | ICD-10-CM | POA: Diagnosis not present

## 2022-06-26 DIAGNOSIS — J301 Allergic rhinitis due to pollen: Secondary | ICD-10-CM | POA: Diagnosis not present

## 2022-06-26 DIAGNOSIS — J3089 Other allergic rhinitis: Secondary | ICD-10-CM | POA: Diagnosis not present

## 2022-07-01 DIAGNOSIS — J029 Acute pharyngitis, unspecified: Secondary | ICD-10-CM | POA: Diagnosis not present

## 2022-07-01 DIAGNOSIS — J069 Acute upper respiratory infection, unspecified: Secondary | ICD-10-CM | POA: Diagnosis not present

## 2022-07-04 DIAGNOSIS — Z1231 Encounter for screening mammogram for malignant neoplasm of breast: Secondary | ICD-10-CM | POA: Diagnosis not present

## 2022-07-05 DIAGNOSIS — J3089 Other allergic rhinitis: Secondary | ICD-10-CM | POA: Diagnosis not present

## 2022-07-05 DIAGNOSIS — J3081 Allergic rhinitis due to animal (cat) (dog) hair and dander: Secondary | ICD-10-CM | POA: Diagnosis not present

## 2022-07-05 DIAGNOSIS — J301 Allergic rhinitis due to pollen: Secondary | ICD-10-CM | POA: Diagnosis not present

## 2022-07-17 DIAGNOSIS — J3089 Other allergic rhinitis: Secondary | ICD-10-CM | POA: Diagnosis not present

## 2022-07-17 DIAGNOSIS — J301 Allergic rhinitis due to pollen: Secondary | ICD-10-CM | POA: Diagnosis not present

## 2022-07-17 DIAGNOSIS — J3081 Allergic rhinitis due to animal (cat) (dog) hair and dander: Secondary | ICD-10-CM | POA: Diagnosis not present

## 2022-07-31 DIAGNOSIS — J301 Allergic rhinitis due to pollen: Secondary | ICD-10-CM | POA: Diagnosis not present

## 2022-07-31 DIAGNOSIS — J3081 Allergic rhinitis due to animal (cat) (dog) hair and dander: Secondary | ICD-10-CM | POA: Diagnosis not present

## 2022-07-31 DIAGNOSIS — J3089 Other allergic rhinitis: Secondary | ICD-10-CM | POA: Diagnosis not present

## 2022-08-14 DIAGNOSIS — J3081 Allergic rhinitis due to animal (cat) (dog) hair and dander: Secondary | ICD-10-CM | POA: Diagnosis not present

## 2022-08-14 DIAGNOSIS — J3089 Other allergic rhinitis: Secondary | ICD-10-CM | POA: Diagnosis not present

## 2022-08-14 DIAGNOSIS — J301 Allergic rhinitis due to pollen: Secondary | ICD-10-CM | POA: Diagnosis not present

## 2022-08-16 DIAGNOSIS — E669 Obesity, unspecified: Secondary | ICD-10-CM | POA: Diagnosis not present

## 2022-08-16 DIAGNOSIS — Z7951 Long term (current) use of inhaled steroids: Secondary | ICD-10-CM | POA: Diagnosis not present

## 2022-08-16 DIAGNOSIS — Z6833 Body mass index (BMI) 33.0-33.9, adult: Secondary | ICD-10-CM | POA: Diagnosis not present

## 2022-08-16 DIAGNOSIS — R030 Elevated blood-pressure reading, without diagnosis of hypertension: Secondary | ICD-10-CM | POA: Diagnosis not present

## 2022-08-16 DIAGNOSIS — R32 Unspecified urinary incontinence: Secondary | ICD-10-CM | POA: Diagnosis not present

## 2022-08-16 DIAGNOSIS — J45909 Unspecified asthma, uncomplicated: Secondary | ICD-10-CM | POA: Diagnosis not present

## 2022-08-16 DIAGNOSIS — E785 Hyperlipidemia, unspecified: Secondary | ICD-10-CM | POA: Diagnosis not present

## 2022-08-16 DIAGNOSIS — E569 Vitamin deficiency, unspecified: Secondary | ICD-10-CM | POA: Diagnosis not present

## 2022-08-16 DIAGNOSIS — B372 Candidiasis of skin and nail: Secondary | ICD-10-CM | POA: Diagnosis not present

## 2022-08-21 DIAGNOSIS — J3089 Other allergic rhinitis: Secondary | ICD-10-CM | POA: Diagnosis not present

## 2022-08-21 DIAGNOSIS — J301 Allergic rhinitis due to pollen: Secondary | ICD-10-CM | POA: Diagnosis not present

## 2022-08-21 DIAGNOSIS — J3081 Allergic rhinitis due to animal (cat) (dog) hair and dander: Secondary | ICD-10-CM | POA: Diagnosis not present

## 2022-08-27 DIAGNOSIS — J3081 Allergic rhinitis due to animal (cat) (dog) hair and dander: Secondary | ICD-10-CM | POA: Diagnosis not present

## 2022-08-27 DIAGNOSIS — J301 Allergic rhinitis due to pollen: Secondary | ICD-10-CM | POA: Diagnosis not present

## 2022-08-27 DIAGNOSIS — J3089 Other allergic rhinitis: Secondary | ICD-10-CM | POA: Diagnosis not present

## 2022-09-11 DIAGNOSIS — J301 Allergic rhinitis due to pollen: Secondary | ICD-10-CM | POA: Diagnosis not present

## 2022-09-11 DIAGNOSIS — J3081 Allergic rhinitis due to animal (cat) (dog) hair and dander: Secondary | ICD-10-CM | POA: Diagnosis not present

## 2022-09-11 DIAGNOSIS — J3089 Other allergic rhinitis: Secondary | ICD-10-CM | POA: Diagnosis not present

## 2022-09-17 DIAGNOSIS — J301 Allergic rhinitis due to pollen: Secondary | ICD-10-CM | POA: Diagnosis not present

## 2022-09-17 DIAGNOSIS — J3089 Other allergic rhinitis: Secondary | ICD-10-CM | POA: Diagnosis not present

## 2022-09-17 DIAGNOSIS — J3081 Allergic rhinitis due to animal (cat) (dog) hair and dander: Secondary | ICD-10-CM | POA: Diagnosis not present

## 2022-09-25 DIAGNOSIS — J3089 Other allergic rhinitis: Secondary | ICD-10-CM | POA: Diagnosis not present

## 2022-09-25 DIAGNOSIS — J301 Allergic rhinitis due to pollen: Secondary | ICD-10-CM | POA: Diagnosis not present

## 2022-09-25 DIAGNOSIS — J3081 Allergic rhinitis due to animal (cat) (dog) hair and dander: Secondary | ICD-10-CM | POA: Diagnosis not present

## 2022-09-27 DIAGNOSIS — R490 Dysphonia: Secondary | ICD-10-CM | POA: Diagnosis not present

## 2022-09-27 DIAGNOSIS — H93293 Other abnormal auditory perceptions, bilateral: Secondary | ICD-10-CM | POA: Diagnosis not present

## 2022-09-27 DIAGNOSIS — H93299 Other abnormal auditory perceptions, unspecified ear: Secondary | ICD-10-CM | POA: Diagnosis not present

## 2022-09-27 DIAGNOSIS — J45909 Unspecified asthma, uncomplicated: Secondary | ICD-10-CM | POA: Diagnosis not present

## 2022-09-27 DIAGNOSIS — K219 Gastro-esophageal reflux disease without esophagitis: Secondary | ICD-10-CM | POA: Diagnosis not present

## 2022-10-12 DIAGNOSIS — J3081 Allergic rhinitis due to animal (cat) (dog) hair and dander: Secondary | ICD-10-CM | POA: Diagnosis not present

## 2022-10-12 DIAGNOSIS — J301 Allergic rhinitis due to pollen: Secondary | ICD-10-CM | POA: Diagnosis not present

## 2022-10-12 DIAGNOSIS — J3089 Other allergic rhinitis: Secondary | ICD-10-CM | POA: Diagnosis not present

## 2022-10-24 DIAGNOSIS — J3081 Allergic rhinitis due to animal (cat) (dog) hair and dander: Secondary | ICD-10-CM | POA: Diagnosis not present

## 2022-10-24 DIAGNOSIS — J3089 Other allergic rhinitis: Secondary | ICD-10-CM | POA: Diagnosis not present

## 2022-10-24 DIAGNOSIS — J301 Allergic rhinitis due to pollen: Secondary | ICD-10-CM | POA: Diagnosis not present

## 2022-11-07 DIAGNOSIS — J3081 Allergic rhinitis due to animal (cat) (dog) hair and dander: Secondary | ICD-10-CM | POA: Diagnosis not present

## 2022-11-07 DIAGNOSIS — J301 Allergic rhinitis due to pollen: Secondary | ICD-10-CM | POA: Diagnosis not present

## 2022-11-07 DIAGNOSIS — J3089 Other allergic rhinitis: Secondary | ICD-10-CM | POA: Diagnosis not present

## 2022-11-14 DIAGNOSIS — J301 Allergic rhinitis due to pollen: Secondary | ICD-10-CM | POA: Diagnosis not present

## 2022-11-14 DIAGNOSIS — J3089 Other allergic rhinitis: Secondary | ICD-10-CM | POA: Diagnosis not present

## 2022-11-14 DIAGNOSIS — J3081 Allergic rhinitis due to animal (cat) (dog) hair and dander: Secondary | ICD-10-CM | POA: Diagnosis not present

## 2022-11-20 DIAGNOSIS — J301 Allergic rhinitis due to pollen: Secondary | ICD-10-CM | POA: Diagnosis not present

## 2022-11-20 DIAGNOSIS — L821 Other seborrheic keratosis: Secondary | ICD-10-CM | POA: Diagnosis not present

## 2022-11-20 DIAGNOSIS — J3081 Allergic rhinitis due to animal (cat) (dog) hair and dander: Secondary | ICD-10-CM | POA: Diagnosis not present

## 2022-11-20 DIAGNOSIS — J3089 Other allergic rhinitis: Secondary | ICD-10-CM | POA: Diagnosis not present

## 2022-11-28 DIAGNOSIS — J301 Allergic rhinitis due to pollen: Secondary | ICD-10-CM | POA: Diagnosis not present

## 2022-11-28 DIAGNOSIS — J3081 Allergic rhinitis due to animal (cat) (dog) hair and dander: Secondary | ICD-10-CM | POA: Diagnosis not present

## 2022-11-28 DIAGNOSIS — J3089 Other allergic rhinitis: Secondary | ICD-10-CM | POA: Diagnosis not present

## 2022-12-06 DIAGNOSIS — J301 Allergic rhinitis due to pollen: Secondary | ICD-10-CM | POA: Diagnosis not present

## 2022-12-06 DIAGNOSIS — J3081 Allergic rhinitis due to animal (cat) (dog) hair and dander: Secondary | ICD-10-CM | POA: Diagnosis not present

## 2022-12-06 DIAGNOSIS — J3089 Other allergic rhinitis: Secondary | ICD-10-CM | POA: Diagnosis not present

## 2022-12-18 DIAGNOSIS — J3081 Allergic rhinitis due to animal (cat) (dog) hair and dander: Secondary | ICD-10-CM | POA: Diagnosis not present

## 2022-12-18 DIAGNOSIS — J301 Allergic rhinitis due to pollen: Secondary | ICD-10-CM | POA: Diagnosis not present

## 2022-12-18 DIAGNOSIS — J3089 Other allergic rhinitis: Secondary | ICD-10-CM | POA: Diagnosis not present

## 2022-12-26 DIAGNOSIS — J3081 Allergic rhinitis due to animal (cat) (dog) hair and dander: Secondary | ICD-10-CM | POA: Diagnosis not present

## 2022-12-26 DIAGNOSIS — J3089 Other allergic rhinitis: Secondary | ICD-10-CM | POA: Diagnosis not present

## 2022-12-26 DIAGNOSIS — J301 Allergic rhinitis due to pollen: Secondary | ICD-10-CM | POA: Diagnosis not present

## 2023-01-01 DIAGNOSIS — J3081 Allergic rhinitis due to animal (cat) (dog) hair and dander: Secondary | ICD-10-CM | POA: Diagnosis not present

## 2023-01-01 DIAGNOSIS — J3089 Other allergic rhinitis: Secondary | ICD-10-CM | POA: Diagnosis not present

## 2023-01-01 DIAGNOSIS — J301 Allergic rhinitis due to pollen: Secondary | ICD-10-CM | POA: Diagnosis not present

## 2023-01-07 DIAGNOSIS — J301 Allergic rhinitis due to pollen: Secondary | ICD-10-CM | POA: Diagnosis not present

## 2023-01-07 DIAGNOSIS — J3081 Allergic rhinitis due to animal (cat) (dog) hair and dander: Secondary | ICD-10-CM | POA: Diagnosis not present

## 2023-01-07 DIAGNOSIS — J3089 Other allergic rhinitis: Secondary | ICD-10-CM | POA: Diagnosis not present

## 2023-01-16 DIAGNOSIS — J3089 Other allergic rhinitis: Secondary | ICD-10-CM | POA: Diagnosis not present

## 2023-01-16 DIAGNOSIS — J301 Allergic rhinitis due to pollen: Secondary | ICD-10-CM | POA: Diagnosis not present

## 2023-01-16 DIAGNOSIS — J3081 Allergic rhinitis due to animal (cat) (dog) hair and dander: Secondary | ICD-10-CM | POA: Diagnosis not present

## 2023-01-24 DIAGNOSIS — J3089 Other allergic rhinitis: Secondary | ICD-10-CM | POA: Diagnosis not present

## 2023-01-24 DIAGNOSIS — J301 Allergic rhinitis due to pollen: Secondary | ICD-10-CM | POA: Diagnosis not present

## 2023-01-24 DIAGNOSIS — J3081 Allergic rhinitis due to animal (cat) (dog) hair and dander: Secondary | ICD-10-CM | POA: Diagnosis not present

## 2023-02-04 DIAGNOSIS — J3081 Allergic rhinitis due to animal (cat) (dog) hair and dander: Secondary | ICD-10-CM | POA: Diagnosis not present

## 2023-02-04 DIAGNOSIS — J3089 Other allergic rhinitis: Secondary | ICD-10-CM | POA: Diagnosis not present

## 2023-02-04 DIAGNOSIS — J301 Allergic rhinitis due to pollen: Secondary | ICD-10-CM | POA: Diagnosis not present

## 2023-02-13 DIAGNOSIS — J301 Allergic rhinitis due to pollen: Secondary | ICD-10-CM | POA: Diagnosis not present

## 2023-02-13 DIAGNOSIS — J3089 Other allergic rhinitis: Secondary | ICD-10-CM | POA: Diagnosis not present

## 2023-02-13 DIAGNOSIS — J3081 Allergic rhinitis due to animal (cat) (dog) hair and dander: Secondary | ICD-10-CM | POA: Diagnosis not present

## 2023-02-20 DIAGNOSIS — J301 Allergic rhinitis due to pollen: Secondary | ICD-10-CM | POA: Diagnosis not present

## 2023-02-20 DIAGNOSIS — J3081 Allergic rhinitis due to animal (cat) (dog) hair and dander: Secondary | ICD-10-CM | POA: Diagnosis not present

## 2023-02-20 DIAGNOSIS — J3089 Other allergic rhinitis: Secondary | ICD-10-CM | POA: Diagnosis not present

## 2023-02-25 DIAGNOSIS — J3081 Allergic rhinitis due to animal (cat) (dog) hair and dander: Secondary | ICD-10-CM | POA: Diagnosis not present

## 2023-02-25 DIAGNOSIS — J301 Allergic rhinitis due to pollen: Secondary | ICD-10-CM | POA: Diagnosis not present

## 2023-02-25 DIAGNOSIS — J3089 Other allergic rhinitis: Secondary | ICD-10-CM | POA: Diagnosis not present

## 2023-03-07 DIAGNOSIS — J3081 Allergic rhinitis due to animal (cat) (dog) hair and dander: Secondary | ICD-10-CM | POA: Diagnosis not present

## 2023-03-07 DIAGNOSIS — J301 Allergic rhinitis due to pollen: Secondary | ICD-10-CM | POA: Diagnosis not present

## 2023-03-07 DIAGNOSIS — J3089 Other allergic rhinitis: Secondary | ICD-10-CM | POA: Diagnosis not present

## 2023-03-18 DIAGNOSIS — J301 Allergic rhinitis due to pollen: Secondary | ICD-10-CM | POA: Diagnosis not present

## 2023-03-18 DIAGNOSIS — J3081 Allergic rhinitis due to animal (cat) (dog) hair and dander: Secondary | ICD-10-CM | POA: Diagnosis not present

## 2023-03-18 DIAGNOSIS — J3089 Other allergic rhinitis: Secondary | ICD-10-CM | POA: Diagnosis not present

## 2023-03-20 DIAGNOSIS — F331 Major depressive disorder, recurrent, moderate: Secondary | ICD-10-CM | POA: Diagnosis not present

## 2023-03-20 DIAGNOSIS — E7800 Pure hypercholesterolemia, unspecified: Secondary | ICD-10-CM | POA: Diagnosis not present

## 2023-03-20 DIAGNOSIS — F520 Hypoactive sexual desire disorder: Secondary | ICD-10-CM | POA: Diagnosis not present

## 2023-03-25 DIAGNOSIS — J3081 Allergic rhinitis due to animal (cat) (dog) hair and dander: Secondary | ICD-10-CM | POA: Diagnosis not present

## 2023-03-25 DIAGNOSIS — J301 Allergic rhinitis due to pollen: Secondary | ICD-10-CM | POA: Diagnosis not present

## 2023-03-25 DIAGNOSIS — J3089 Other allergic rhinitis: Secondary | ICD-10-CM | POA: Diagnosis not present

## 2023-04-16 DIAGNOSIS — J301 Allergic rhinitis due to pollen: Secondary | ICD-10-CM | POA: Diagnosis not present

## 2023-04-16 DIAGNOSIS — J4541 Moderate persistent asthma with (acute) exacerbation: Secondary | ICD-10-CM | POA: Diagnosis not present

## 2023-04-16 DIAGNOSIS — J3089 Other allergic rhinitis: Secondary | ICD-10-CM | POA: Diagnosis not present

## 2023-04-16 DIAGNOSIS — J3081 Allergic rhinitis due to animal (cat) (dog) hair and dander: Secondary | ICD-10-CM | POA: Diagnosis not present

## 2023-04-17 ENCOUNTER — Encounter: Payer: Self-pay | Admitting: Internal Medicine

## 2023-04-24 DIAGNOSIS — J3081 Allergic rhinitis due to animal (cat) (dog) hair and dander: Secondary | ICD-10-CM | POA: Diagnosis not present

## 2023-04-24 DIAGNOSIS — J301 Allergic rhinitis due to pollen: Secondary | ICD-10-CM | POA: Diagnosis not present

## 2023-04-24 DIAGNOSIS — J3089 Other allergic rhinitis: Secondary | ICD-10-CM | POA: Diagnosis not present

## 2023-04-29 DIAGNOSIS — J3081 Allergic rhinitis due to animal (cat) (dog) hair and dander: Secondary | ICD-10-CM | POA: Diagnosis not present

## 2023-04-29 DIAGNOSIS — J301 Allergic rhinitis due to pollen: Secondary | ICD-10-CM | POA: Diagnosis not present

## 2023-04-29 DIAGNOSIS — J3089 Other allergic rhinitis: Secondary | ICD-10-CM | POA: Diagnosis not present

## 2023-05-08 DIAGNOSIS — J3081 Allergic rhinitis due to animal (cat) (dog) hair and dander: Secondary | ICD-10-CM | POA: Diagnosis not present

## 2023-05-08 DIAGNOSIS — J3089 Other allergic rhinitis: Secondary | ICD-10-CM | POA: Diagnosis not present

## 2023-05-08 DIAGNOSIS — J301 Allergic rhinitis due to pollen: Secondary | ICD-10-CM | POA: Diagnosis not present

## 2023-05-13 DIAGNOSIS — J3089 Other allergic rhinitis: Secondary | ICD-10-CM | POA: Diagnosis not present

## 2023-05-13 DIAGNOSIS — J301 Allergic rhinitis due to pollen: Secondary | ICD-10-CM | POA: Diagnosis not present

## 2023-05-13 DIAGNOSIS — J3081 Allergic rhinitis due to animal (cat) (dog) hair and dander: Secondary | ICD-10-CM | POA: Diagnosis not present

## 2023-05-21 DIAGNOSIS — J301 Allergic rhinitis due to pollen: Secondary | ICD-10-CM | POA: Diagnosis not present

## 2023-05-21 DIAGNOSIS — J3081 Allergic rhinitis due to animal (cat) (dog) hair and dander: Secondary | ICD-10-CM | POA: Diagnosis not present

## 2023-05-21 DIAGNOSIS — J3089 Other allergic rhinitis: Secondary | ICD-10-CM | POA: Diagnosis not present

## 2023-05-23 ENCOUNTER — Encounter: Payer: Self-pay | Admitting: Internal Medicine

## 2023-05-23 ENCOUNTER — Ambulatory Visit (AMBULATORY_SURGERY_CENTER): Payer: Medicare PPO | Admitting: *Deleted

## 2023-05-23 VITALS — Ht 66.0 in | Wt 200.0 lb

## 2023-05-23 DIAGNOSIS — Z86010 Personal history of colonic polyps: Secondary | ICD-10-CM

## 2023-05-23 MED ORDER — NA SULFATE-K SULFATE-MG SULF 17.5-3.13-1.6 GM/177ML PO SOLN: 1.0000 | Freq: Once | ORAL | 0 refills | Status: AC

## 2023-05-23 NOTE — Progress Notes (Signed)

## 2023-05-28 DIAGNOSIS — J3081 Allergic rhinitis due to animal (cat) (dog) hair and dander: Secondary | ICD-10-CM | POA: Diagnosis not present

## 2023-05-28 DIAGNOSIS — J3089 Other allergic rhinitis: Secondary | ICD-10-CM | POA: Diagnosis not present

## 2023-05-28 DIAGNOSIS — J301 Allergic rhinitis due to pollen: Secondary | ICD-10-CM | POA: Diagnosis not present

## 2023-06-03 DIAGNOSIS — J3089 Other allergic rhinitis: Secondary | ICD-10-CM | POA: Diagnosis not present

## 2023-06-03 DIAGNOSIS — J4540 Moderate persistent asthma, uncomplicated: Secondary | ICD-10-CM | POA: Diagnosis not present

## 2023-06-03 DIAGNOSIS — J3081 Allergic rhinitis due to animal (cat) (dog) hair and dander: Secondary | ICD-10-CM | POA: Diagnosis not present

## 2023-06-03 DIAGNOSIS — J301 Allergic rhinitis due to pollen: Secondary | ICD-10-CM | POA: Diagnosis not present

## 2023-06-11 ENCOUNTER — Encounter: Payer: Self-pay | Admitting: Internal Medicine

## 2023-06-11 ENCOUNTER — Ambulatory Visit (AMBULATORY_SURGERY_CENTER): Payer: Medicare PPO | Admitting: Internal Medicine

## 2023-06-11 VITALS — BP 110/73 | HR 77 | Temp 97.3°F | Resp 13 | Ht 66.0 in | Wt 200.0 lb

## 2023-06-11 DIAGNOSIS — D123 Benign neoplasm of transverse colon: Secondary | ICD-10-CM | POA: Diagnosis not present

## 2023-06-11 DIAGNOSIS — E785 Hyperlipidemia, unspecified: Secondary | ICD-10-CM | POA: Diagnosis not present

## 2023-06-11 DIAGNOSIS — Z09 Encounter for follow-up examination after completed treatment for conditions other than malignant neoplasm: Secondary | ICD-10-CM | POA: Diagnosis not present

## 2023-06-11 DIAGNOSIS — Z86010 Personal history of colonic polyps: Secondary | ICD-10-CM

## 2023-06-11 DIAGNOSIS — J45909 Unspecified asthma, uncomplicated: Secondary | ICD-10-CM | POA: Diagnosis not present

## 2023-06-11 DIAGNOSIS — K6389 Other specified diseases of intestine: Secondary | ICD-10-CM | POA: Diagnosis not present

## 2023-06-11 MED ORDER — SODIUM CHLORIDE 0.9 % IV SOLN: 500.0000 mL | Freq: Once | INTRAVENOUS | Status: DC

## 2023-06-11 NOTE — Progress Notes (Signed)
Called to room to assist during endoscopic procedure.  Patient ID and intended procedure confirmed with present staff. Received instructions for my participation in the procedure from the performing physician.  

## 2023-06-11 NOTE — Op Note (Signed)
[facility] Patient Name: Ebony Jimenez Procedure Date: 06/11/2023 2:53 PM MRN: [id number] Endoscopist: Beverley Fiedler , MD, [id number] Age: 67 Referring MD:  Date of Birth: [birth year] Gender: Female Account #: [id number] Procedure:                Colonoscopy Indications:              High risk colon cancer surveillance: Personal                            history of non-advanced adenoma, Last colonoscopy:                            May 2019 Medicines:                Monitored Anesthesia Care Procedure:                Pre-Anesthesia Assessment:                           - Prior to the procedure, a History and Physical                            was performed, and patient medications and                            allergies were reviewed. The patient's tolerance of                            previous anesthesia was also reviewed. The risks                            and benefits of the procedure and the sedation                            options and risks were discussed with the patient.                            All questions were answered, and informed consent                            was obtained. Prior Anticoagulants: The patient has                            taken no anticoagulant or antiplatelet agents. ASA                            Grade Assessment: II - A patient with mild systemic                            disease. After reviewing the risks and benefits,                            the patient was deemed in satisfactory condition to  undergo the procedure.                           After obtaining informed consent, the colonoscope                            was passed under direct vision. Throughout the                            procedure, the patient's blood pressure, pulse, and                            oxygen saturations were monitored continuously. The                            Olympus CF-HQ190L ([id number]) Colonoscope was                             introduced through the anus and advanced to the                            cecum, identified by appendiceal orifice and                            ileocecal valve. The colonoscopy was performed                            without difficulty. The patient tolerated the                            procedure well. The quality of the bowel                            preparation was good. The ileocecal valve,                            appendiceal orifice, and rectum were photographed. Scope In: 3:07:54 PM Scope Out: 3:20:48 PM Scope Withdrawal Time: 0 hours 10 minutes 8 seconds  Total Procedure Duration: 0 hours 12 minutes 54 seconds  Findings:                 The digital rectal exam was normal.                           A 4 mm polyp was found in the transverse colon. The                            polyp was sessile. The polyp was removed with a                            cold snare. Resection and retrieval were complete.                           Multiple medium-mouthed and small-mouthed  diverticula were found in the sigmoid colon.                           Internal hemorrhoids were found during                            retroflexion. The hemorrhoids were small. Complications:            No immediate complications. Estimated Blood Loss:     Estimated blood loss was minimal. Impression:               - One 4 mm polyp in the transverse colon, removed                            with a cold snare. Resected and retrieved.                           - Moderate diverticulosis in the sigmoid colon.                           - Small internal hemorrhoids. Recommendation:           - Patient has a contact number available for                            emergencies. The signs and symptoms of potential                            delayed complications were discussed with the                            patient. Return to normal activities tomorrow.                             Written discharge instructions were provided to the                            patient.                           - Resume previous diet.                           - Continue present medications.                           - Await pathology results.                           - Repeat colonoscopy is recommended for                            surveillance. The colonoscopy date will be                            determined after pathology results from today's  exam become available for review. Beverley Fiedler, MD 06/11/2023 3:23:03 PM This report has been signed electronically.

## 2023-06-11 NOTE — Patient Instructions (Addendum)
Resume previous diet. Continue present medications. Await pathology results. Repeat colonoscopy is recommended for surveillance. The colonoscopy date will be determined after pathology results from today's exam become available for review.  YOU HAD AN ENDOSCOPIC PROCEDURE TODAY AT THE [facility]:   Refer to the procedure report that was given to you for any specific questions about what was found during the examination.  If the procedure report does not answer your questions, please call your gastroenterologist to clarify.  If you requested that your care partner not be given the details of your procedure findings, then the procedure report has been included in a sealed envelope for you to review at your convenience later.  YOU SHOULD EXPECT: Some feelings of bloating in the abdomen. Passage of more gas than usual.  Walking can help get rid of the air that was put into your GI tract during the procedure and reduce the bloating. If you had a lower endoscopy (such as a colonoscopy or flexible sigmoidoscopy) you may notice spotting of blood in your stool or on the toilet paper. If you underwent a bowel prep for your procedure, you may not have a normal bowel movement for a few days.  Please Note:  You might notice some irritation and congestion in your nose or some drainage.  This is from the oxygen used during your procedure.  There is no need for concern and it should clear up in a day or so.  SYMPTOMS TO REPORT IMMEDIATELY:  Following lower endoscopy (colonoscopy or flexible sigmoidoscopy):  Excessive amounts of blood in the stool  Significant tenderness or worsening of abdominal pains  Swelling of the abdomen that is new, acute  Fever of 100F or higher   For urgent or emergent issues, a gastroenterologist can be reached at any hour by calling [phone]. Do not use MyChart messaging for urgent concerns.    DIET:  We do recommend a small meal at first, but then you may  proceed to your regular diet.  Drink plenty of fluids but you should avoid alcoholic beverages for 24 hours.  ACTIVITY:  You should plan to take it easy for the rest of today and you should NOT DRIVE or use heavy machinery until tomorrow (because of the sedation medicines used during the test).    FOLLOW UP: Our staff will call the number listed on your records the next business day following your procedure.  We will call around 7:15- 8:00 am to check on you and address any questions or concerns that you may have regarding the information given to you following your procedure. If we do not reach you, we will leave a message.     If any biopsies were taken you will be contacted by phone or by letter within the next 1-3 weeks.  Please call us at [phone] if you have not heard about the biopsies in 3 weeks.    SIGNATURES/CONFIDENTIALITY: You and/or your care partner have signed paperwork which will be entered into your electronic medical record.  These signatures attest to the fact that that the information above on your After Visit Summary has been reviewed and is understood.  Full responsibility of the confidentiality of this discharge information lies with you and/or your care-partner. 

## 2023-06-11 NOTE — Progress Notes (Signed)
To pacu, VSS. Report to [email or web address] 

## 2023-06-11 NOTE — Progress Notes (Signed)
GASTROENTEROLOGY PROCEDURE H&P NOTE   Primary Care Physician: Shon Hale, MD    Reason for Procedure:  History of colon polyps  Plan:    Colonoscopy  Patient is appropriate for endoscopic procedure(s) in the ambulatory ([facility]) setting.  The nature of the procedure, as well as the risks, benefits, and alternatives were carefully and thoroughly reviewed with the patient. Ample time for discussion and questions allowed. The patient understood, was satisfied, and agreed to proceed.     HPI: Ebony Jimenez is a 66 y.o. female who presents for colonoscopy.  Medical history as below.  Tolerated the prep.  No recent chest pain or shortness of breath.  No abdominal pain today.  Past Medical History:  Diagnosis Date   Allergy    Asthma    Hyperlipidemia    diet controlled    Past Surgical History:  Procedure Laterality Date   BREAST ENHANCEMENT SURGERY     COLONOSCOPY     10 yrs ago    DILATION AND CURETTAGE OF UTERUS     after miscarriage   HAND SURGERY Right    age 27   POLYPECTOMY     pre cancerous polyps per pt at colon 10 yrs ago -    RETAINED PLACENTA REMOVAL      Prior to Admission medications   Medication Sig Start Date End Date Taking? Authorizing Provider  albuterol (VENTOLIN HFA) 108 (90 Base) MCG/ACT inhaler Inhale 1 puff into the lungs every 6 (six) hours as needed for wheezing or shortness of breath.  03/23/19  Yes [provider]  budesonide-formoterol (SYMBICORT) 80-4.5 MCG/ACT inhaler TAKE 2 PUFFS FIRST THING IN THE MORNING AND THEN ANOTHER 2 PUFFS ABOUT 12 HOURS LATER. Patient taking differently: Inhale 2 puffs into the lungs in the morning and at bedtime. 08/19/17  Yes Nyoka Cowden, MD  Coenzyme Q10 (Q-SORB CO Q-10) 100 MG capsule 300 mg daily.   Yes [provider]  fluticasone (FLONASE ALLERGY RELIEF) 50 MCG/ACT nasal spray 1 spray in each nostril Nasally Once a day   Yes [provider]  folic acid (FOLVITE) 1 MG  tablet Take 1 mg by mouth daily. 12/04/17  Yes [provider]  levocetirizine (XYZAL) 5 MG tablet Take 5 mg by mouth every evening.  03/20/19  Yes [provider]  Misc Natural Products (NEURIVA) CAPS daily 11/23/21  Yes [provider]  montelukast (SINGULAIR) 10 MG tablet Take 10 mg by mouth at bedtime.  05/30/15  Yes [provider]  NON FORMULARY Magnesium paste- applies at bedtime PRN   Yes [provider]  Potassium (POTASSIMIN PO) 1 tablet Orally Once a day for 30 day(s)   Yes [provider]  pravastatin (PRAVACHOL) 20 MG tablet Take 20 mg by mouth daily.   Yes [provider]  EPINEPHrine 0.3 mg/0.3 mL IJ SOAJ injection Inject 0.3 mg into the muscle as needed for anaphylaxis.  Patient not taking: Reported on 05/23/2023 01/07/18   [provider]  estradiol (ESTRACE) 0.1 MG/GM vaginal cream Place 1 Applicatorful vaginally daily. Uses PRN    [provider]  predniSONE (DELTASONE) 20 MG tablet Take 2 tablets (40 mg total) by mouth daily with breakfast. Patient taking differently: Take 40 mg by mouth daily with breakfast. PRN with asthma flare-up 03/28/20   Lorin Glass, MD  terbinafine (LAMISIL) 250 MG tablet PLEASE TAKE ONE A DAY X 7DAYS, REPEAT EVERY 4 WEEKS X 4 MONTHS 04/30/19   Cristie Hem  S, DPM  Vitamin D, Ergocalciferol, (DRISDOL) 50000 units CAPS capsule Take 50,000 Units by mouth every 7 (seven) days.  01/24/18   [provider]    Current Outpatient Medications  Medication Sig Dispense Refill   albuterol (VENTOLIN HFA) 108 (90 Base) MCG/ACT inhaler Inhale 1 puff into the lungs every 6 (six) hours as needed for wheezing or shortness of breath.      budesonide-formoterol (SYMBICORT) 80-4.5 MCG/ACT inhaler TAKE 2 PUFFS FIRST THING IN THE MORNING AND THEN ANOTHER 2 PUFFS ABOUT 12 HOURS LATER. (Patient taking differently: Inhale 2 puffs into the lungs in the morning and at bedtime.) 10.2 Inhaler 10    Coenzyme Q10 (Q-SORB CO Q-10) 100 MG capsule 300 mg daily.     fluticasone (FLONASE ALLERGY RELIEF) 50 MCG/ACT nasal spray 1 spray in each nostril Nasally Once a day     folic acid (FOLVITE) 1 MG tablet Take 1 mg by mouth daily.  3   levocetirizine (XYZAL) 5 MG tablet Take 5 mg by mouth every evening.      Misc Natural Products (NEURIVA) CAPS daily     montelukast (SINGULAIR) 10 MG tablet Take 10 mg by mouth at bedtime.      NON FORMULARY Magnesium paste- applies at bedtime PRN     Potassium (POTASSIMIN PO) 1 tablet Orally Once a day for 30 day(s)     pravastatin (PRAVACHOL) 20 MG tablet Take 20 mg by mouth daily.     EPINEPHrine 0.3 mg/0.3 mL IJ SOAJ injection Inject 0.3 mg into the muscle as needed for anaphylaxis.  (Patient not taking: Reported on 05/23/2023)  0   estradiol (ESTRACE) 0.1 MG/GM vaginal cream Place 1 Applicatorful vaginally daily. Uses PRN     predniSONE (DELTASONE) 20 MG tablet Take 2 tablets (40 mg total) by mouth daily with breakfast. (Patient taking differently: Take 40 mg by mouth daily with breakfast. PRN with asthma flare-up) 14 tablet 1   terbinafine (LAMISIL) 250 MG tablet PLEASE TAKE ONE A DAY X 7DAYS, REPEAT EVERY 4 WEEKS X 4 MONTHS 28 tablet 0   Vitamin D, Ergocalciferol, (DRISDOL) 50000 units CAPS capsule Take 50,000 Units by mouth every 7 (seven) days.   1   Current Facility-Administered Medications  Medication Dose Route Frequency Provider Last Rate Last Admin   0.9 %  sodium chloride infusion  500 mL Intravenous Once Ramzy Cappelletti, Carie Caddy, MD       0.9 %  sodium chloride infusion  500 mL Intravenous Once Denzil Bristol, Carie Caddy, MD        Allergies as of 06/11/2023 - Review Complete 06/11/2023  Allergen Reaction Noted   Buspirone Other (See Comments) 05/23/2023   Citalopram Other (See Comments) 05/23/2023    Family History  Problem Relation Age of Onset   Asthma Brother    Asthma Mother    Heart disease Mother    Asthma Sister    Cancer Father        mouth- never  smoked   Colon cancer Neg Hx    Colon polyps Neg Hx    Esophageal cancer Neg Hx    Rectal cancer Neg Hx    Stomach cancer Neg Hx     Social History   Socioeconomic History   Marital status: Significant Other    Spouse name: Not on file   Number of children: Not on file   Years of education: Not on file   Highest education level: Not on file  Occupational History   Not on  file  Tobacco Use   Smoking status: Never   Smokeless tobacco: Never  Vaping Use   Vaping status: Never Used  Substance and Sexual Activity   Alcohol use: Yes    Alcohol/week: 0.0 standard drinks of alcohol    Comment: socially   Drug use: No   Sexual activity: Not on file  Other Topics Concern   Not on file  Social History Narrative   Not on file   Social Determinants of Health   Financial Resource Strain: Not on file  Food Insecurity: Not on file  Transportation Needs: Not on file  Physical Activity: Not on file  Stress: Not on file  Social Connections: Not on file  Intimate Partner Violence: Not on file    Physical Exam: Vital signs in last 24 hours: @BP  117/73   Pulse 77   Temp (!) 97.3 F (36.3 C)   Ht 5\' 6"  (1.676 m)   Wt 200 lb (90.7 kg)   LMP 06/30/2011   SpO2 94%   BMI 32.28 kg/m  GEN: NAD EYE: Sclerae anicteric ENT: MMM CV: Non-tachycardic Pulm: CTA b/l GI: Soft, NT/ND NEURO:  Alert & Oriented x 3   Erick Blinks, MD [facility]  06/11/2023 2:55 PM

## 2023-06-12 ENCOUNTER — Telehealth: Payer: Self-pay

## 2023-06-12 NOTE — Telephone Encounter (Signed)
  Follow up Call-     06/11/2023    2:12 PM  Call back number  Post procedure Call Back phone  # [phone]  Permission to leave phone message Yes     Patient questions:  Do you have a fever, pain , or abdominal swelling? No. Pain Score  0 *  Have you tolerated food without any problems? Yes.    Have you been able to return to your normal activities? Yes.    Do you have any questions about your discharge instructions: Diet   No. Medications  No. Follow up visit  No.  Do you have questions or concerns about your Care? No.  Actions: * If pain score is 4 or above: No action needed, pain <4.

## 2023-06-19 DIAGNOSIS — N816 Rectocele: Secondary | ICD-10-CM | POA: Diagnosis not present

## 2023-06-20 ENCOUNTER — Encounter: Payer: Self-pay | Admitting: Internal Medicine

## 2023-07-02 DIAGNOSIS — E559 Vitamin D deficiency, unspecified: Secondary | ICD-10-CM | POA: Diagnosis not present

## 2023-07-02 DIAGNOSIS — Z79899 Other long term (current) drug therapy: Secondary | ICD-10-CM | POA: Diagnosis not present

## 2023-07-02 DIAGNOSIS — J3081 Allergic rhinitis due to animal (cat) (dog) hair and dander: Secondary | ICD-10-CM | POA: Diagnosis not present

## 2023-07-02 DIAGNOSIS — E7800 Pure hypercholesterolemia, unspecified: Secondary | ICD-10-CM | POA: Diagnosis not present

## 2023-07-02 DIAGNOSIS — J3089 Other allergic rhinitis: Secondary | ICD-10-CM | POA: Diagnosis not present

## 2023-07-02 DIAGNOSIS — J301 Allergic rhinitis due to pollen: Secondary | ICD-10-CM | POA: Diagnosis not present

## 2023-07-03 DIAGNOSIS — M8589 Other specified disorders of bone density and structure, multiple sites: Secondary | ICD-10-CM | POA: Diagnosis not present

## 2023-07-03 DIAGNOSIS — Z79899 Other long term (current) drug therapy: Secondary | ICD-10-CM | POA: Diagnosis not present

## 2023-07-03 DIAGNOSIS — Z0000 Encounter for general adult medical examination without abnormal findings: Secondary | ICD-10-CM | POA: Diagnosis not present

## 2023-07-03 DIAGNOSIS — E559 Vitamin D deficiency, unspecified: Secondary | ICD-10-CM | POA: Diagnosis not present

## 2023-07-03 DIAGNOSIS — E7800 Pure hypercholesterolemia, unspecified: Secondary | ICD-10-CM | POA: Diagnosis not present

## 2023-07-03 DIAGNOSIS — J4540 Moderate persistent asthma, uncomplicated: Secondary | ICD-10-CM | POA: Diagnosis not present

## 2023-07-03 DIAGNOSIS — M79672 Pain in left foot: Secondary | ICD-10-CM | POA: Diagnosis not present

## 2023-07-03 DIAGNOSIS — Z23 Encounter for immunization: Secondary | ICD-10-CM | POA: Diagnosis not present

## 2023-07-10 DIAGNOSIS — Z1231 Encounter for screening mammogram for malignant neoplasm of breast: Secondary | ICD-10-CM | POA: Diagnosis not present

## 2023-07-11 DIAGNOSIS — J301 Allergic rhinitis due to pollen: Secondary | ICD-10-CM | POA: Diagnosis not present

## 2023-07-11 DIAGNOSIS — J3081 Allergic rhinitis due to animal (cat) (dog) hair and dander: Secondary | ICD-10-CM | POA: Diagnosis not present

## 2023-07-11 DIAGNOSIS — J3089 Other allergic rhinitis: Secondary | ICD-10-CM | POA: Diagnosis not present

## 2023-07-15 DIAGNOSIS — J3089 Other allergic rhinitis: Secondary | ICD-10-CM | POA: Diagnosis not present

## 2023-07-15 DIAGNOSIS — J3081 Allergic rhinitis due to animal (cat) (dog) hair and dander: Secondary | ICD-10-CM | POA: Diagnosis not present

## 2023-07-15 DIAGNOSIS — J301 Allergic rhinitis due to pollen: Secondary | ICD-10-CM | POA: Diagnosis not present

## 2023-07-24 DIAGNOSIS — J3089 Other allergic rhinitis: Secondary | ICD-10-CM | POA: Diagnosis not present

## 2023-07-24 DIAGNOSIS — J3081 Allergic rhinitis due to animal (cat) (dog) hair and dander: Secondary | ICD-10-CM | POA: Diagnosis not present

## 2023-07-24 DIAGNOSIS — J301 Allergic rhinitis due to pollen: Secondary | ICD-10-CM | POA: Diagnosis not present

## 2023-08-02 DIAGNOSIS — J301 Allergic rhinitis due to pollen: Secondary | ICD-10-CM | POA: Diagnosis not present

## 2023-08-02 DIAGNOSIS — J3081 Allergic rhinitis due to animal (cat) (dog) hair and dander: Secondary | ICD-10-CM | POA: Diagnosis not present

## 2023-08-02 DIAGNOSIS — J3089 Other allergic rhinitis: Secondary | ICD-10-CM | POA: Diagnosis not present

## 2023-08-08 DIAGNOSIS — J301 Allergic rhinitis due to pollen: Secondary | ICD-10-CM | POA: Diagnosis not present

## 2023-08-08 DIAGNOSIS — J3081 Allergic rhinitis due to animal (cat) (dog) hair and dander: Secondary | ICD-10-CM | POA: Diagnosis not present

## 2023-08-08 DIAGNOSIS — J3089 Other allergic rhinitis: Secondary | ICD-10-CM | POA: Diagnosis not present

## 2023-08-14 DIAGNOSIS — J301 Allergic rhinitis due to pollen: Secondary | ICD-10-CM | POA: Diagnosis not present

## 2023-08-14 DIAGNOSIS — J3089 Other allergic rhinitis: Secondary | ICD-10-CM | POA: Diagnosis not present

## 2023-08-14 DIAGNOSIS — J3081 Allergic rhinitis due to animal (cat) (dog) hair and dander: Secondary | ICD-10-CM | POA: Diagnosis not present

## 2023-08-16 DIAGNOSIS — J301 Allergic rhinitis due to pollen: Secondary | ICD-10-CM | POA: Diagnosis not present

## 2023-08-16 DIAGNOSIS — J3089 Other allergic rhinitis: Secondary | ICD-10-CM | POA: Diagnosis not present

## 2023-08-16 DIAGNOSIS — J3081 Allergic rhinitis due to animal (cat) (dog) hair and dander: Secondary | ICD-10-CM | POA: Diagnosis not present

## 2023-08-22 DIAGNOSIS — J3089 Other allergic rhinitis: Secondary | ICD-10-CM | POA: Diagnosis not present

## 2023-08-22 DIAGNOSIS — J301 Allergic rhinitis due to pollen: Secondary | ICD-10-CM | POA: Diagnosis not present

## 2023-08-22 DIAGNOSIS — J3081 Allergic rhinitis due to animal (cat) (dog) hair and dander: Secondary | ICD-10-CM | POA: Diagnosis not present

## 2023-08-25 ENCOUNTER — Emergency Department (HOSPITAL_BASED_OUTPATIENT_CLINIC_OR_DEPARTMENT_OTHER): Payer: Medicare PPO | Admitting: Radiology

## 2023-08-25 ENCOUNTER — Encounter (HOSPITAL_BASED_OUTPATIENT_CLINIC_OR_DEPARTMENT_OTHER): Payer: Self-pay | Admitting: Emergency Medicine

## 2023-08-25 ENCOUNTER — Emergency Department (HOSPITAL_BASED_OUTPATIENT_CLINIC_OR_DEPARTMENT_OTHER): Payer: Medicare PPO

## 2023-08-25 ENCOUNTER — Other Ambulatory Visit: Payer: Self-pay

## 2023-08-25 ENCOUNTER — Emergency Department (HOSPITAL_BASED_OUTPATIENT_CLINIC_OR_DEPARTMENT_OTHER)
Admission: EM | Admit: 2023-08-25 | Discharge: 2023-08-25 | Disposition: A | Discharge status: Home / Self Care | Payer: Medicare PPO | Attending: Emergency Medicine | Admitting: Emergency Medicine

## 2023-08-25 DIAGNOSIS — S93401A Sprain of unspecified ligament of right ankle, initial encounter: Secondary | ICD-10-CM | POA: Diagnosis not present

## 2023-08-25 DIAGNOSIS — M7989 Other specified soft tissue disorders: Secondary | ICD-10-CM | POA: Diagnosis not present

## 2023-08-25 DIAGNOSIS — Z043 Encounter for examination and observation following other accident: Secondary | ICD-10-CM | POA: Diagnosis not present

## 2023-08-25 DIAGNOSIS — S99911A Unspecified injury of right ankle, initial encounter: Secondary | ICD-10-CM | POA: Diagnosis present

## 2023-08-25 DIAGNOSIS — S93491A Sprain of other ligament of right ankle, initial encounter: Secondary | ICD-10-CM | POA: Diagnosis not present

## 2023-08-25 DIAGNOSIS — X501XXA Overexertion from prolonged static or awkward postures, initial encounter: Secondary | ICD-10-CM | POA: Insufficient documentation

## 2023-08-25 DIAGNOSIS — M79671 Pain in right foot: Secondary | ICD-10-CM | POA: Diagnosis not present

## 2023-08-25 MED ORDER — IBUPROFEN 400 MG PO TABS
400.0000 mg | ORAL_TABLET | Freq: Once | ORAL | Status: AC
  Administered 2023-08-25: 400 mg via ORAL
  Filled 2023-08-25: qty 1

## 2023-08-25 NOTE — Discharge Instructions (Addendum)
Elevating your leg and staying off of it for the next few days as well as taking Tylenol ibuprofen as needed for pain.  Use the brace for the next 2 weeks but it may need to be worn longer depending on your pain.  If at 2 weeks you are still having a lot of pain follow-up with the orthopedist.

## 2023-08-25 NOTE — ED Triage Notes (Signed)
Pt fell tonight on foam flooring and twisted R ankle. Denies hitting her head, not on blood thinners and no LOC.

## 2023-08-26 DIAGNOSIS — S93401A Sprain of unspecified ligament of right ankle, initial encounter: Secondary | ICD-10-CM | POA: Diagnosis not present

## 2023-08-26 NOTE — ED Provider Notes (Signed)
[place] EMERGENCY DEPARTMENT AT [facility] Provider Note   CSN: [id number] Arrival date & time: 08/25/23  1725     History  Chief Complaint  Patient presents with   Ebony Jimenez is a 67 y.o. female.  Patient is a 67 year old female presenting today after she fell and twisted her ankle.  She reports that she was at her granddaughter's event and did not see the step in the floor and fell twisting her ankle.  She otherwise denies any other injury.  Arms feel normal, she did not hit her head or lose any consciousness.  Having pain in her right ankle.  It is more painful when she walks.  No pain in her hip or knee.  The history is provided by the patient.  Fall       Home Medications Prior to Admission medications   Medication Sig Start Date End Date Taking? Authorizing Provider  albuterol (VENTOLIN HFA) 108 (90 Base) MCG/ACT inhaler Inhale 1 puff into the lungs every 6 (six) hours as needed for wheezing or shortness of breath.  03/23/19   [provider]  budesonide-formoterol (SYMBICORT) 80-4.5 MCG/ACT inhaler TAKE 2 PUFFS FIRST THING IN THE MORNING AND THEN ANOTHER 2 PUFFS ABOUT 12 HOURS LATER. Patient taking differently: Inhale 2 puffs into the lungs in the morning and at bedtime. 08/19/17   Nyoka Cowden, MD  Coenzyme Q10 (Q-SORB CO Q-10) 100 MG capsule 300 mg daily.    [provider]  EPINEPHrine 0.3 mg/0.3 mL IJ SOAJ injection Inject 0.3 mg into the muscle as needed for anaphylaxis.  Patient not taking: Reported on 05/23/2023 01/07/18   [provider]  estradiol (ESTRACE) 0.1 MG/GM vaginal cream Place 1 Applicatorful vaginally daily. Uses PRN    [provider]  fluticasone (FLONASE ALLERGY RELIEF) 50 MCG/ACT nasal spray 1 spray in each nostril Nasally Once a day    [provider]  folic acid (FOLVITE) 1 MG tablet Take 1 mg by mouth daily. 12/04/17   [provider]  levocetirizine (XYZAL) 5 MG tablet  Take 5 mg by mouth every evening.  03/20/19   [provider]  Misc Natural Products (NEURIVA) CAPS daily 11/23/21   [provider]  montelukast (SINGULAIR) 10 MG tablet Take 10 mg by mouth at bedtime.  05/30/15   [provider]  NON FORMULARY Magnesium paste- applies at bedtime PRN    [provider]  Potassium (POTASSIMIN PO) 1 tablet Orally Once a day for 30 day(s)    [provider]  pravastatin (PRAVACHOL) 20 MG tablet Take 20 mg by mouth daily.    [provider]  predniSONE (DELTASONE) 20 MG tablet Take 2 tablets (40 mg total) by mouth daily with breakfast. Patient taking differently: Take 40 mg by mouth daily with breakfast. PRN with asthma flare-up 03/28/20   Lorin Glass, MD  terbinafine (LAMISIL) 250 MG tablet PLEASE TAKE ONE A DAY X 7DAYS, REPEAT EVERY 4 WEEKS X 4 MONTHS 04/30/19   Lenn Sink, DPM  Vitamin D, Ergocalciferol, (DRISDOL) 50000 units CAPS capsule Take 50,000 Units by mouth every 7 (seven) days.  01/24/18   [provider]      Allergies    Buspirone and Citalopram    Review of Systems   Review of Systems  Physical Exam Updated Vital Signs BP 122/84   Pulse 74   Temp 98 F (36.7 C) (Oral)   Resp 20   Ht  5\' 6"  (1.676 m)   Wt 90.7 kg   LMP 06/30/2011   SpO2 95%   BMI 32.28 kg/m  Physical Exam Vitals and nursing note reviewed.  HENT:     Head: Normocephalic.  Cardiovascular:     Pulses: Normal pulses.  Musculoskeletal:        General: Tenderness present.     Right ankle: Swelling and ecchymosis present. Tenderness present over the ATF ligament and AITF ligament. No lateral malleolus, base of 5th metatarsal or proximal fibula tenderness.       Legs:  Neurological:     Mental Status: She is alert. Mental status is at baseline.     ED Results / Procedures / Treatments   Labs (all labs ordered are listed, but only abnormal results are displayed) Labs Reviewed - No data to  display  EKG None  Radiology DG Foot Complete Right  Result Date: 08/25/2023 CLINICAL DATA:  Pain and swelling EXAM: RIGHT FOOT COMPLETE - 3+ VIEW COMPARISON:  None Available. FINDINGS: There is no evidence of fracture or dislocation. There is no evidence of arthropathy or other focal bone abnormality. Soft tissues are unremarkable. IMPRESSION: Negative. Electronically Signed   By: Darliss Cheney M.D.   On: 08/25/2023 22:01   DG Ankle Complete Right  Result Date: 08/25/2023 CLINICAL DATA:  Fall EXAM: RIGHT ANKLE - COMPLETE 3+ VIEW COMPARISON:  None Available. FINDINGS: There is no evidence of fracture, dislocation, or joint effusion. There is no evidence of arthropathy or other focal bone abnormality. Soft tissues are unremarkable. IMPRESSION: Negative. Electronically Signed   By: Darliss Cheney M.D.   On: 08/25/2023 19:46    Procedures Procedures    Medications Ordered in ED Medications  ibuprofen (ADVIL) tablet 400 mg (400 mg Oral Given 08/25/23 2052)    ED Course/ Medical Decision Making/ A&P                                 Medical Decision Making Amount and/or Complexity of Data Reviewed Radiology: ordered.  Risk Prescription drug management.   Patient with fall today and twisted her ankle.  No other evidence of injury.  Patient does have ecchymosis and tenderness in the left lateral foot and ankle.  She does have proximal fifth metatarsal tenderness. I have independently visualized and interpreted pt's images today.  Foot and ankle images today show no evidence of acute fracture.  Patient treated for sprain.         Final Clinical Impression(s) / ED Diagnoses Final diagnoses:  Sprain of right ankle, unspecified ligament, initial encounter    Rx / DC Orders ED Discharge Orders     None         Gwyneth Sprout, MD 08/26/23 [phone]

## 2023-09-03 DIAGNOSIS — J301 Allergic rhinitis due to pollen: Secondary | ICD-10-CM | POA: Diagnosis not present

## 2023-09-03 DIAGNOSIS — J3089 Other allergic rhinitis: Secondary | ICD-10-CM | POA: Diagnosis not present

## 2023-09-03 DIAGNOSIS — J3081 Allergic rhinitis due to animal (cat) (dog) hair and dander: Secondary | ICD-10-CM | POA: Diagnosis not present

## 2023-09-17 DIAGNOSIS — J3089 Other allergic rhinitis: Secondary | ICD-10-CM | POA: Diagnosis not present

## 2023-09-17 DIAGNOSIS — J3081 Allergic rhinitis due to animal (cat) (dog) hair and dander: Secondary | ICD-10-CM | POA: Diagnosis not present

## 2023-09-17 DIAGNOSIS — J301 Allergic rhinitis due to pollen: Secondary | ICD-10-CM | POA: Diagnosis not present

## 2023-09-25 DIAGNOSIS — J301 Allergic rhinitis due to pollen: Secondary | ICD-10-CM | POA: Diagnosis not present

## 2023-09-25 DIAGNOSIS — L820 Inflamed seborrheic keratosis: Secondary | ICD-10-CM | POA: Diagnosis not present

## 2023-09-25 DIAGNOSIS — D492 Neoplasm of unspecified behavior of bone, soft tissue, and skin: Secondary | ICD-10-CM | POA: Diagnosis not present

## 2023-09-25 DIAGNOSIS — J3089 Other allergic rhinitis: Secondary | ICD-10-CM | POA: Diagnosis not present

## 2023-09-25 DIAGNOSIS — J3081 Allergic rhinitis due to animal (cat) (dog) hair and dander: Secondary | ICD-10-CM | POA: Diagnosis not present

## 2023-09-25 DIAGNOSIS — D225 Melanocytic nevi of trunk: Secondary | ICD-10-CM | POA: Diagnosis not present

## 2023-09-25 DIAGNOSIS — L538 Other specified erythematous conditions: Secondary | ICD-10-CM | POA: Diagnosis not present

## 2023-09-27 DIAGNOSIS — J4541 Moderate persistent asthma with (acute) exacerbation: Secondary | ICD-10-CM | POA: Diagnosis not present

## 2023-10-01 DIAGNOSIS — J3089 Other allergic rhinitis: Secondary | ICD-10-CM | POA: Diagnosis not present

## 2023-10-01 DIAGNOSIS — J301 Allergic rhinitis due to pollen: Secondary | ICD-10-CM | POA: Diagnosis not present

## 2023-10-01 DIAGNOSIS — J3081 Allergic rhinitis due to animal (cat) (dog) hair and dander: Secondary | ICD-10-CM | POA: Diagnosis not present

## 2023-10-07 DIAGNOSIS — J3081 Allergic rhinitis due to animal (cat) (dog) hair and dander: Secondary | ICD-10-CM | POA: Diagnosis not present

## 2023-10-07 DIAGNOSIS — J301 Allergic rhinitis due to pollen: Secondary | ICD-10-CM | POA: Diagnosis not present

## 2023-10-07 DIAGNOSIS — J3089 Other allergic rhinitis: Secondary | ICD-10-CM | POA: Diagnosis not present

## 2023-10-11 ENCOUNTER — Institutional Professional Consult (permissible substitution): Payer: Medicare PPO | Admitting: Pulmonary Disease

## 2023-10-17 DIAGNOSIS — J3089 Other allergic rhinitis: Secondary | ICD-10-CM | POA: Diagnosis not present

## 2023-10-17 DIAGNOSIS — J3081 Allergic rhinitis due to animal (cat) (dog) hair and dander: Secondary | ICD-10-CM | POA: Diagnosis not present

## 2023-10-17 DIAGNOSIS — J301 Allergic rhinitis due to pollen: Secondary | ICD-10-CM | POA: Diagnosis not present

## 2023-10-22 DIAGNOSIS — J3081 Allergic rhinitis due to animal (cat) (dog) hair and dander: Secondary | ICD-10-CM | POA: Diagnosis not present

## 2023-10-22 DIAGNOSIS — J301 Allergic rhinitis due to pollen: Secondary | ICD-10-CM | POA: Diagnosis not present

## 2023-10-22 DIAGNOSIS — J3089 Other allergic rhinitis: Secondary | ICD-10-CM | POA: Diagnosis not present

## 2023-10-28 DIAGNOSIS — J3081 Allergic rhinitis due to animal (cat) (dog) hair and dander: Secondary | ICD-10-CM | POA: Diagnosis not present

## 2023-10-28 DIAGNOSIS — J301 Allergic rhinitis due to pollen: Secondary | ICD-10-CM | POA: Diagnosis not present

## 2023-10-28 DIAGNOSIS — J3089 Other allergic rhinitis: Secondary | ICD-10-CM | POA: Diagnosis not present

## 2023-11-05 DIAGNOSIS — J3081 Allergic rhinitis due to animal (cat) (dog) hair and dander: Secondary | ICD-10-CM | POA: Diagnosis not present

## 2023-11-05 DIAGNOSIS — J301 Allergic rhinitis due to pollen: Secondary | ICD-10-CM | POA: Diagnosis not present

## 2023-11-05 DIAGNOSIS — J3089 Other allergic rhinitis: Secondary | ICD-10-CM | POA: Diagnosis not present

## 2023-11-11 DIAGNOSIS — J3089 Other allergic rhinitis: Secondary | ICD-10-CM | POA: Diagnosis not present

## 2023-11-11 DIAGNOSIS — J301 Allergic rhinitis due to pollen: Secondary | ICD-10-CM | POA: Diagnosis not present

## 2023-11-11 DIAGNOSIS — J3081 Allergic rhinitis due to animal (cat) (dog) hair and dander: Secondary | ICD-10-CM | POA: Diagnosis not present

## 2023-11-18 DIAGNOSIS — J3089 Other allergic rhinitis: Secondary | ICD-10-CM | POA: Diagnosis not present

## 2023-11-18 DIAGNOSIS — J301 Allergic rhinitis due to pollen: Secondary | ICD-10-CM | POA: Diagnosis not present

## 2023-11-18 DIAGNOSIS — J3081 Allergic rhinitis due to animal (cat) (dog) hair and dander: Secondary | ICD-10-CM | POA: Diagnosis not present

## 2023-11-26 DIAGNOSIS — J3081 Allergic rhinitis due to animal (cat) (dog) hair and dander: Secondary | ICD-10-CM | POA: Diagnosis not present

## 2023-11-26 DIAGNOSIS — J301 Allergic rhinitis due to pollen: Secondary | ICD-10-CM | POA: Diagnosis not present

## 2023-11-26 DIAGNOSIS — J3089 Other allergic rhinitis: Secondary | ICD-10-CM | POA: Diagnosis not present

## 2023-11-27 DIAGNOSIS — L821 Other seborrheic keratosis: Secondary | ICD-10-CM | POA: Diagnosis not present

## 2023-11-27 DIAGNOSIS — Z85828 Personal history of other malignant neoplasm of skin: Secondary | ICD-10-CM | POA: Diagnosis not present

## 2023-11-27 DIAGNOSIS — L239 Allergic contact dermatitis, unspecified cause: Secondary | ICD-10-CM | POA: Diagnosis not present

## 2023-11-27 DIAGNOSIS — D225 Melanocytic nevi of trunk: Secondary | ICD-10-CM | POA: Diagnosis not present

## 2023-11-27 DIAGNOSIS — Z08 Encounter for follow-up examination after completed treatment for malignant neoplasm: Secondary | ICD-10-CM | POA: Diagnosis not present

## 2023-11-27 DIAGNOSIS — L814 Other melanin hyperpigmentation: Secondary | ICD-10-CM | POA: Diagnosis not present

## 2023-12-02 DIAGNOSIS — J3081 Allergic rhinitis due to animal (cat) (dog) hair and dander: Secondary | ICD-10-CM | POA: Diagnosis not present

## 2023-12-02 DIAGNOSIS — J3089 Other allergic rhinitis: Secondary | ICD-10-CM | POA: Diagnosis not present

## 2023-12-02 DIAGNOSIS — J301 Allergic rhinitis due to pollen: Secondary | ICD-10-CM | POA: Diagnosis not present

## 2023-12-11 ENCOUNTER — Institutional Professional Consult (permissible substitution): Payer: Medicare PPO | Admitting: Pulmonary Disease

## 2023-12-11 DIAGNOSIS — J301 Allergic rhinitis due to pollen: Secondary | ICD-10-CM | POA: Diagnosis not present

## 2023-12-11 DIAGNOSIS — J3089 Other allergic rhinitis: Secondary | ICD-10-CM | POA: Diagnosis not present

## 2023-12-11 DIAGNOSIS — J3081 Allergic rhinitis due to animal (cat) (dog) hair and dander: Secondary | ICD-10-CM | POA: Diagnosis not present

## 2023-12-18 DIAGNOSIS — J3081 Allergic rhinitis due to animal (cat) (dog) hair and dander: Secondary | ICD-10-CM | POA: Diagnosis not present

## 2023-12-18 DIAGNOSIS — J3089 Other allergic rhinitis: Secondary | ICD-10-CM | POA: Diagnosis not present

## 2023-12-18 DIAGNOSIS — J301 Allergic rhinitis due to pollen: Secondary | ICD-10-CM | POA: Diagnosis not present

## 2024-01-01 DIAGNOSIS — J3089 Other allergic rhinitis: Secondary | ICD-10-CM | POA: Diagnosis not present

## 2024-01-01 DIAGNOSIS — J3081 Allergic rhinitis due to animal (cat) (dog) hair and dander: Secondary | ICD-10-CM | POA: Diagnosis not present

## 2024-01-01 DIAGNOSIS — J301 Allergic rhinitis due to pollen: Secondary | ICD-10-CM | POA: Diagnosis not present

## 2024-01-07 DIAGNOSIS — J3089 Other allergic rhinitis: Secondary | ICD-10-CM | POA: Diagnosis not present

## 2024-01-07 DIAGNOSIS — J301 Allergic rhinitis due to pollen: Secondary | ICD-10-CM | POA: Diagnosis not present

## 2024-01-07 DIAGNOSIS — J3081 Allergic rhinitis due to animal (cat) (dog) hair and dander: Secondary | ICD-10-CM | POA: Diagnosis not present

## 2024-01-13 DIAGNOSIS — J3089 Other allergic rhinitis: Secondary | ICD-10-CM | POA: Diagnosis not present

## 2024-01-13 DIAGNOSIS — J301 Allergic rhinitis due to pollen: Secondary | ICD-10-CM | POA: Diagnosis not present

## 2024-01-13 DIAGNOSIS — J3081 Allergic rhinitis due to animal (cat) (dog) hair and dander: Secondary | ICD-10-CM | POA: Diagnosis not present

## 2024-01-21 DIAGNOSIS — J3089 Other allergic rhinitis: Secondary | ICD-10-CM | POA: Diagnosis not present

## 2024-01-21 DIAGNOSIS — J3081 Allergic rhinitis due to animal (cat) (dog) hair and dander: Secondary | ICD-10-CM | POA: Diagnosis not present

## 2024-01-21 DIAGNOSIS — J301 Allergic rhinitis due to pollen: Secondary | ICD-10-CM | POA: Diagnosis not present

## 2024-02-05 ENCOUNTER — Encounter: Payer: Self-pay | Admitting: Pulmonary Disease

## 2024-02-05 ENCOUNTER — Ambulatory Visit: Payer: Medicare PPO | Admitting: Pulmonary Disease

## 2024-02-05 VITALS — BP 115/82 | HR 85 | Ht 66.0 in | Wt 203.0 lb

## 2024-02-05 DIAGNOSIS — K219 Gastro-esophageal reflux disease without esophagitis: Secondary | ICD-10-CM | POA: Diagnosis not present

## 2024-02-05 DIAGNOSIS — J3089 Other allergic rhinitis: Secondary | ICD-10-CM | POA: Diagnosis not present

## 2024-02-05 DIAGNOSIS — J4541 Moderate persistent asthma with (acute) exacerbation: Secondary | ICD-10-CM

## 2024-02-05 DIAGNOSIS — J3081 Allergic rhinitis due to animal (cat) (dog) hair and dander: Secondary | ICD-10-CM | POA: Diagnosis not present

## 2024-02-05 DIAGNOSIS — J301 Allergic rhinitis due to pollen: Secondary | ICD-10-CM | POA: Diagnosis not present

## 2024-02-05 LAB — CBC WITH DIFFERENTIAL/PLATELET
Basophils Absolute: 0 10*3/uL (ref 0.0–0.1)
Basophils Relative: 0.6 % (ref 0.0–3.0)
Eosinophils Absolute: 0.7 10*3/uL (ref 0.0–0.7)
Eosinophils Relative: 9.6 % — ABNORMAL HIGH (ref 0.0–5.0)
HCT: 42.4 % (ref 36.0–46.0)
Hemoglobin: 14 g/dL (ref 12.0–15.0)
Lymphocytes Relative: 25.9 % (ref 12.0–46.0)
Lymphs Abs: 1.8 10*3/uL (ref 0.7–4.0)
MCHC: 33.1 g/dL (ref 30.0–36.0)
MCV: 89.8 fl (ref 78.0–100.0)
Monocytes Absolute: 0.4 10*3/uL (ref 0.1–1.0)
Monocytes Relative: 6.1 % (ref 3.0–12.0)
Neutro Abs: 4 10*3/uL (ref 1.4–7.7)
Neutrophils Relative %: 57.8 % (ref 43.0–77.0)
Platelets: 200 10*3/uL (ref 150.0–400.0)
RBC: 4.72 Mil/uL (ref 3.87–5.11)
RDW: 14.9 % (ref 11.5–15.5)
WBC: 6.9 10*3/uL (ref 4.0–10.5)

## 2024-02-05 MED ORDER — FAMOTIDINE 20 MG PO TABS: 20.0000 mg | ORAL_TABLET | Freq: Every day | ORAL | Status: DC

## 2024-02-05 MED ORDER — BUDESONIDE-FORMOTEROL FUMARATE 160-4.5 MCG/ACT IN AERO: 2.0000 | INHALATION_SPRAY | Freq: Two times a day (BID) | RESPIRATORY_TRACT | 6 refills | Status: DC

## 2024-02-05 MED ORDER — PREDNISONE 10 MG PO TABS: ORAL_TABLET | ORAL | 0 refills | Status: AC

## 2024-02-05 NOTE — Progress Notes (Signed)
 Synopsis: Referred in April 2025  for asthma  Subjective:   PATIENT ID: Ebony Jimenez DOB: 1956/05/08, MRN: [id number]   HPI  Chief Complaint  Patient presents with   Consult    Pt states trip to [place] and had a reaction from the pollen since then she's still not better can't take a deep breath, sputum yellow     Ebony Jimenez is a 68 year old woman, never smoker with history of persistent asthma who is referred to pulmonary clinic.   She has a history of asthma and has been using montelukast and Symbicort , mostly once a day, sometimes twice. A significant exacerbation of her asthma symptoms occurred after a trip to [place] in June of the previous year, where exposure to pollen led to nasal swelling and a need for emergency care. She was treated with prednisone  and allergy medication at that time. Since then, she has not returned to her previous baseline of symptom control.  Currently, she uses her maintenance inhaler twice daily and her rescue inhaler twice daily, whereas previously she only needed the maintenance inhaler once daily. She also takes cough medicine in the morning and at night. Cutting down on dairy has helped clear her lungs, but spring allergies seem to exacerbate her symptoms.  She has been diagnosed with GERD and was taking Tums, which caused indigestion. She has stopped using Tums and is cautious about consuming tomato-based foods, which she enjoys but believes contribute to her symptoms. No significant weight changes recently, although she gained weight during the COVID-19 pandemic due to increased baking.  She uses a flutter device to help clear mucus from her chest and reports that it helps her cough, although she does not have trouble coughing. She coughs more in the morning and notes that for two years she did not need tissues by her bed, indicating better control of her symptoms during that time.  Her family history includes asthma, with her sister  having had a severe episode at 30. Her brother, sister, and mother have had polyps, but she has not experienced this issue.  Past Medical History:  Diagnosis Date   Allergy    Asthma    Hyperlipidemia    diet controlled     Family History  Problem Relation Age of Onset   Asthma Brother    Asthma Mother    Heart disease Mother    Asthma Sister    Cancer Father        mouth- never smoked   Colon cancer Neg Hx    Colon polyps Neg Hx    Esophageal cancer Neg Hx    Rectal cancer Neg Hx    Stomach cancer Neg Hx      Social History   Socioeconomic History   Marital status: Significant Other    Spouse name: Not on file   Number of children: Not on file   Years of education: Not on file   Highest education level: Not on file  Occupational History   Not on file  Tobacco Use   Smoking status: Never   Smokeless tobacco: Never  Vaping Use   Vaping status: Never Used  Substance and Sexual Activity   Alcohol use: Yes    Alcohol/week: 0.0 standard drinks of alcohol    Comment: socially   Drug use: No   Sexual activity: Not on file  Other Topics Concern   Not on file  Social History Narrative   Not on file  Social Drivers of Corporate investment banker Strain: Not on file  Food Insecurity: Not on file  Transportation Needs: Not on file  Physical Activity: Not on file  Stress: Not on file  Social Connections: Not on file  Intimate Partner Violence: Not on file     Allergies  Allergen Reactions   Buspirone Other (See Comments)    Extreme drowsiness   Citalopram Other (See Comments)    Extreme drowsiness     Outpatient Medications Prior to Visit  Medication Sig Dispense Refill   albuterol  (VENTOLIN  HFA) 108 (90 Base) MCG/ACT inhaler Inhale 1 puff into the lungs every 6 (six) hours as needed for wheezing or shortness of breath.      Coenzyme Q10 (Q-SORB CO Q-10) 100 MG capsule 300 mg daily.     EPINEPHrine 0.3 mg/0.3 mL IJ SOAJ injection Inject 0.3 mg into  the muscle as needed for anaphylaxis.  0   estradiol (ESTRACE) 0.1 MG/GM vaginal cream Place 1 Applicatorful vaginally daily. Uses PRN     fluticasone (FLONASE ALLERGY RELIEF) 50 MCG/ACT nasal spray 1 spray in each nostril Nasally Once a day     folic acid (FOLVITE) 1 MG tablet Take 1 mg by mouth daily.  3   levocetirizine (XYZAL) 5 MG tablet Take 5 mg by mouth every evening.      Misc Natural Products (NEURIVA) CAPS daily     montelukast (SINGULAIR) 10 MG tablet Take 10 mg by mouth at bedtime.      NON FORMULARY Magnesium paste- applies at bedtime PRN     Potassium (POTASSIMIN PO) 1 tablet Orally Once a day for 30 day(s)     pravastatin (PRAVACHOL) 20 MG tablet Take 20 mg by mouth daily.     terbinafine  (LAMISIL ) 250 MG tablet PLEASE TAKE ONE A DAY X 7DAYS, REPEAT EVERY 4 WEEKS X 4 MONTHS 28 tablet 0   Vitamin D, Ergocalciferol, (DRISDOL) 50000 units CAPS capsule Take 50,000 Units by mouth every 7 (seven) days.   1   budesonide -formoterol  (SYMBICORT ) 80-4.5 MCG/ACT inhaler TAKE 2 PUFFS FIRST THING IN THE MORNING AND THEN ANOTHER 2 PUFFS ABOUT 12 HOURS LATER. (Patient taking differently: Inhale 2 puffs into the lungs in the morning and at bedtime.) 10.2 Inhaler 10   predniSONE  (DELTASONE ) 20 MG tablet Take 2 tablets (40 mg total) by mouth daily with breakfast. (Patient taking differently: Take 40 mg by mouth daily with breakfast. PRN with asthma flare-up) 14 tablet 1   No facility-administered medications prior to visit.    Review of Systems  Constitutional:  Negative for chills, fever, malaise/fatigue and weight loss.  HENT:  Negative for congestion, sinus pain and sore throat.   Eyes: Negative.   Respiratory:  Positive for cough, sputum production, shortness of breath and wheezing. Negative for hemoptysis.   Cardiovascular:  Negative for chest pain, palpitations, orthopnea, claudication and leg swelling.  Gastrointestinal:  Negative for abdominal pain, heartburn, nausea and vomiting.   Genitourinary: Negative.   Musculoskeletal:  Negative for joint pain and myalgias.  Skin:  Negative for rash.  Neurological:  Negative for weakness.  Endo/Heme/Allergies: Negative.   Psychiatric/Behavioral: Negative.        Objective:   Vitals:   02/05/24 1509  BP: 115/82  Pulse: 85  SpO2: 97%  Weight: 203 lb (92.1 kg)  Height: 5\' 6"  (1.676 m)   Physical Exam Constitutional:      General: She is not in acute distress.    Appearance: Normal appearance.  Eyes:     General: No scleral icterus.    Conjunctiva/sclera: Conjunctivae normal.  Cardiovascular:     Rate and Rhythm: Normal rate and regular rhythm.  Pulmonary:     Breath sounds: Wheezing (scattered) present. No rhonchi or rales.  Musculoskeletal:     Right lower leg: No edema.     Left lower leg: No edema.  Skin:    General: Skin is warm and dry.  Neurological:     General: No focal deficit present.    CBC    Component Value Date/Time   WBC 6.2 03/28/2020 1152   RBC 4.54 03/28/2020 1152   HGB 13.2 03/28/2020 1152   HCT 40.2 03/28/2020 1152   PLT 204.0 03/28/2020 1152   MCV 88.7 03/28/2020 1152   MCH 28.4 01/14/2020 1843   MCHC 32.9 03/28/2020 1152   RDW 14.8 03/28/2020 1152   LYMPHSABS 1.8 03/28/2020 1152   MONOABS 0.4 03/28/2020 1152   EOSABS 0.5 03/28/2020 1152   BASOSABS 0.0 03/28/2020 1152     Chest imaging:  PFT:     No data to display          Labs:  Path:  Echo:  Heart Catheterization:       Assessment & Plan:   Moderate persistent asthma with acute exacerbation - Plan: predniSONE  (DELTASONE ) 10 MG tablet, budesonide -formoterol  (SYMBICORT ) 160-4.5 MCG/ACT inhaler, famotidine  (PEPCID ) 20 MG tablet, CBC with Differential/Platelet, IgE, IgE, CBC with Differential/Platelet  Discussion: Ebony Jimenez is a 68 year old woman, never smoker with history of persistent asthma who is referred to pulmonary clinic.   Asthma - Increase Symbicort  to high dose, two puffs twice daily. -  Initiate prednisone  taper. - Check CBC with diff and IgE level.  Gastroesophageal Reflux Disease (GERD) - Start famotidine  daily. - Provide dietary modification printout. - Consider pantoprazole if famotidine  is ineffective.  Follow up in 3 months  Duaine German, MD [facility] Office: [phone]   See Amion for personal pager PCCM on call pager [phone] until 7pm. Please call Elink 7p-7a. [phone]     Current Outpatient Medications:    albuterol  (VENTOLIN  HFA) 108 (90 Base) MCG/ACT inhaler, Inhale 1 puff into the lungs every 6 (six) hours as needed for wheezing or shortness of breath. , Disp: , Rfl:    budesonide -formoterol  (SYMBICORT ) 160-4.5 MCG/ACT inhaler, Inhale 2 puffs into the lungs 2 (two) times daily., Disp: 1 each, Rfl: 6   Coenzyme Q10 (Q-SORB CO Q-10) 100 MG capsule, 300 mg daily., Disp: , Rfl:    EPINEPHrine 0.3 mg/0.3 mL IJ SOAJ injection, Inject 0.3 mg into the muscle as needed for anaphylaxis., Disp: , Rfl: 0   estradiol (ESTRACE) 0.1 MG/GM vaginal cream, Place 1 Applicatorful vaginally daily. Uses PRN, Disp: , Rfl:    famotidine  (PEPCID ) 20 MG tablet, Take 1 tablet (20 mg total) by mouth daily., Disp: , Rfl:    fluticasone (FLONASE ALLERGY RELIEF) 50 MCG/ACT nasal spray, 1 spray in each nostril Nasally Once a day, Disp: , Rfl:    folic acid (FOLVITE) 1 MG tablet, Take 1 mg by mouth daily., Disp: , Rfl: 3   levocetirizine (XYZAL) 5 MG tablet, Take 5 mg by mouth every evening. , Disp: , Rfl:    Misc Natural Products (NEURIVA) CAPS, daily, Disp: , Rfl:    montelukast (SINGULAIR) 10 MG tablet, Take 10 mg by mouth at bedtime. , Disp: , Rfl:    NON FORMULARY, Magnesium paste- applies at bedtime PRN, Disp: ,  Rfl:    Potassium (POTASSIMIN PO), 1 tablet Orally Once a day for 30 day(s), Disp: , Rfl:    pravastatin (PRAVACHOL) 20 MG tablet, Take 20 mg by mouth daily., Disp: , Rfl:    predniSONE  (DELTASONE ) 10 MG tablet, Take 4 tablets  (40 mg total) by mouth daily with breakfast for 3 days, THEN 3 tablets (30 mg total) daily with breakfast for 3 days, THEN 2 tablets (20 mg total) daily with breakfast for 3 days, THEN 1 tablet (10 mg total) daily with breakfast for 3 days., Disp: 30 tablet, Rfl: 0   terbinafine  (LAMISIL ) 250 MG tablet, PLEASE TAKE ONE A DAY X 7DAYS, REPEAT EVERY 4 WEEKS X 4 MONTHS, Disp: 28 tablet, Rfl: 0   Vitamin D, Ergocalciferol, (DRISDOL) 50000 units CAPS capsule, Take 50,000 Units by mouth every 7 (seven) days. , Disp: , Rfl: 1

## 2024-02-05 NOTE — Patient Instructions (Addendum)
 Start prednisone  taper 40mg  daily x 3 days 30mg  daily x 3 days 20mg  daily x 3 days 10mg  daily x 3 days  We will increase your symbicort  dose to 180-4.32mcg from 80-4.51mcg 2 puffs twice daily  Continue montelukast 10mg  daily  Start famotidine  20mg  daily for reflux  We will check labs today to check for inflammation related to asthma  Follow up in 3 months

## 2024-02-07 LAB — IGE: IgE (Immunoglobulin E), Serum: 197 kU/L — ABNORMAL HIGH (ref <=114)

## 2024-02-12 DIAGNOSIS — J301 Allergic rhinitis due to pollen: Secondary | ICD-10-CM | POA: Diagnosis not present

## 2024-02-12 DIAGNOSIS — J3081 Allergic rhinitis due to animal (cat) (dog) hair and dander: Secondary | ICD-10-CM | POA: Diagnosis not present

## 2024-02-12 DIAGNOSIS — J3089 Other allergic rhinitis: Secondary | ICD-10-CM | POA: Diagnosis not present

## 2024-02-20 DIAGNOSIS — J3081 Allergic rhinitis due to animal (cat) (dog) hair and dander: Secondary | ICD-10-CM | POA: Diagnosis not present

## 2024-02-20 DIAGNOSIS — J301 Allergic rhinitis due to pollen: Secondary | ICD-10-CM | POA: Diagnosis not present

## 2024-02-20 DIAGNOSIS — J3089 Other allergic rhinitis: Secondary | ICD-10-CM | POA: Diagnosis not present

## 2024-02-25 DIAGNOSIS — J3081 Allergic rhinitis due to animal (cat) (dog) hair and dander: Secondary | ICD-10-CM | POA: Diagnosis not present

## 2024-02-25 DIAGNOSIS — J301 Allergic rhinitis due to pollen: Secondary | ICD-10-CM | POA: Diagnosis not present

## 2024-02-25 DIAGNOSIS — J3089 Other allergic rhinitis: Secondary | ICD-10-CM | POA: Diagnosis not present

## 2024-03-04 DIAGNOSIS — J3089 Other allergic rhinitis: Secondary | ICD-10-CM | POA: Diagnosis not present

## 2024-03-04 DIAGNOSIS — J301 Allergic rhinitis due to pollen: Secondary | ICD-10-CM | POA: Diagnosis not present

## 2024-03-04 DIAGNOSIS — J3081 Allergic rhinitis due to animal (cat) (dog) hair and dander: Secondary | ICD-10-CM | POA: Diagnosis not present

## 2024-03-11 DIAGNOSIS — J3081 Allergic rhinitis due to animal (cat) (dog) hair and dander: Secondary | ICD-10-CM | POA: Diagnosis not present

## 2024-03-11 DIAGNOSIS — J3089 Other allergic rhinitis: Secondary | ICD-10-CM | POA: Diagnosis not present

## 2024-03-11 DIAGNOSIS — J301 Allergic rhinitis due to pollen: Secondary | ICD-10-CM | POA: Diagnosis not present

## 2024-03-14 DIAGNOSIS — J4540 Moderate persistent asthma, uncomplicated: Secondary | ICD-10-CM | POA: Diagnosis not present

## 2024-03-14 DIAGNOSIS — F331 Major depressive disorder, recurrent, moderate: Secondary | ICD-10-CM | POA: Diagnosis not present

## 2024-03-14 DIAGNOSIS — J4541 Moderate persistent asthma with (acute) exacerbation: Secondary | ICD-10-CM | POA: Diagnosis not present

## 2024-03-14 DIAGNOSIS — E7800 Pure hypercholesterolemia, unspecified: Secondary | ICD-10-CM | POA: Diagnosis not present

## 2024-03-17 DIAGNOSIS — N816 Rectocele: Secondary | ICD-10-CM | POA: Diagnosis not present

## 2024-03-20 DIAGNOSIS — J301 Allergic rhinitis due to pollen: Secondary | ICD-10-CM | POA: Diagnosis not present

## 2024-03-20 DIAGNOSIS — J3089 Other allergic rhinitis: Secondary | ICD-10-CM | POA: Diagnosis not present

## 2024-03-20 DIAGNOSIS — J3081 Allergic rhinitis due to animal (cat) (dog) hair and dander: Secondary | ICD-10-CM | POA: Diagnosis not present

## 2024-03-26 DIAGNOSIS — J301 Allergic rhinitis due to pollen: Secondary | ICD-10-CM | POA: Diagnosis not present

## 2024-03-26 DIAGNOSIS — J3081 Allergic rhinitis due to animal (cat) (dog) hair and dander: Secondary | ICD-10-CM | POA: Diagnosis not present

## 2024-03-26 DIAGNOSIS — J3089 Other allergic rhinitis: Secondary | ICD-10-CM | POA: Diagnosis not present

## 2024-04-01 DIAGNOSIS — J301 Allergic rhinitis due to pollen: Secondary | ICD-10-CM | POA: Diagnosis not present

## 2024-04-01 DIAGNOSIS — J3089 Other allergic rhinitis: Secondary | ICD-10-CM | POA: Diagnosis not present

## 2024-04-01 DIAGNOSIS — J3081 Allergic rhinitis due to animal (cat) (dog) hair and dander: Secondary | ICD-10-CM | POA: Diagnosis not present

## 2024-04-07 DIAGNOSIS — J3081 Allergic rhinitis due to animal (cat) (dog) hair and dander: Secondary | ICD-10-CM | POA: Diagnosis not present

## 2024-04-07 DIAGNOSIS — J3089 Other allergic rhinitis: Secondary | ICD-10-CM | POA: Diagnosis not present

## 2024-04-07 DIAGNOSIS — J301 Allergic rhinitis due to pollen: Secondary | ICD-10-CM | POA: Diagnosis not present

## 2024-04-13 DIAGNOSIS — J4541 Moderate persistent asthma with (acute) exacerbation: Secondary | ICD-10-CM | POA: Diagnosis not present

## 2024-04-13 DIAGNOSIS — J4540 Moderate persistent asthma, uncomplicated: Secondary | ICD-10-CM | POA: Diagnosis not present

## 2024-04-13 DIAGNOSIS — E7800 Pure hypercholesterolemia, unspecified: Secondary | ICD-10-CM | POA: Diagnosis not present

## 2024-04-13 DIAGNOSIS — F331 Major depressive disorder, recurrent, moderate: Secondary | ICD-10-CM | POA: Diagnosis not present

## 2024-04-16 ENCOUNTER — Ambulatory Visit: Payer: Self-pay | Admitting: Pulmonary Disease

## 2024-04-21 DIAGNOSIS — J3081 Allergic rhinitis due to animal (cat) (dog) hair and dander: Secondary | ICD-10-CM | POA: Diagnosis not present

## 2024-04-21 DIAGNOSIS — J3089 Other allergic rhinitis: Secondary | ICD-10-CM | POA: Diagnosis not present

## 2024-04-21 DIAGNOSIS — J301 Allergic rhinitis due to pollen: Secondary | ICD-10-CM | POA: Diagnosis not present

## 2024-04-22 ENCOUNTER — Ambulatory Visit: Admitting: Pulmonary Disease

## 2024-04-22 ENCOUNTER — Encounter: Payer: Self-pay | Admitting: Pulmonary Disease

## 2024-04-22 VITALS — BP 118/84 | HR 73 | Ht 66.0 in | Wt 206.0 lb

## 2024-04-22 DIAGNOSIS — J4540 Moderate persistent asthma, uncomplicated: Secondary | ICD-10-CM | POA: Diagnosis not present

## 2024-04-22 DIAGNOSIS — K219 Gastro-esophageal reflux disease without esophagitis: Secondary | ICD-10-CM

## 2024-04-22 NOTE — Patient Instructions (Addendum)
 Continue symbicort  2 puffs twice daily - rinse mouth out after each use  Use albuterol  inhaler 1-2 puffs every 4-6 hours as needed  Continue montelukast 10mg  daily  Schedule pulmonary function tests at the front desk  Ok to stop Famotidine  if you feel you are not having reflux issues  Follow up in 6 months, call sooner if needed

## 2024-04-22 NOTE — Progress Notes (Signed)
 Synopsis: Referred in April 2025  for asthma  Subjective:   PATIENT ID: Ebony Jimenez GENDER: female DOB: June 01, 1956, MRN: [id number]  HPI  Chief Complaint  Patient presents with   Follow-up   Ebony Jimenez is a 68 year old woman, never smoker with history of persistent asthma who returns to pulmonary clinic.   Initial OV 02/05/24 She has a history of asthma and has been using montelukast and Symbicort , mostly once a day, sometimes twice. A significant exacerbation of her asthma symptoms occurred after a trip to [place] in June of the previous year, where exposure to pollen led to nasal swelling and a need for emergency care. She was treated with prednisone  and allergy medication at that time. Since then, she has not returned to her previous baseline of symptom control.  Currently, she uses her maintenance inhaler twice daily and her rescue inhaler twice daily, whereas previously she only needed the maintenance inhaler once daily. She also takes cough medicine in the morning and at night. Cutting down on dairy has helped clear her lungs, but spring allergies seem to exacerbate her symptoms.  She has been diagnosed with GERD and was taking Tums, which caused indigestion. She has stopped using Tums and is cautious about consuming tomato-based foods, which she enjoys but believes contribute to her symptoms. No significant weight changes recently, although she gained weight during the COVID-19 pandemic due to increased baking.  She uses a flutter device to help clear mucus from her chest and reports that it helps her cough, although she does not have trouble coughing. She coughs more in the morning and notes that for two years she did not need tissues by her bed, indicating better control of her symptoms during that time.  Her family history includes asthma, with her sister having had a severe episode at 61. Her brother, sister, and mother have had polyps, but she has not experienced this  issue.  OV 04/22/24 She has been doing well since last visit. She feels the extended steroid taper was a huge help.  She is using symbicort  160-4.51mcg 2 puffs in the morning and 1-2 puffs intermittently at night.   She denies issues with reflux and feels that tums and pepcid  give her indigestion. She is not taking pepcid  anymore.   She is taking montelukast and getting allergy shots.  Past Medical History:  Diagnosis Date   Allergy    Asthma    Hyperlipidemia    diet controlled     Family History  Problem Relation Age of Onset   Asthma Brother    Asthma Mother    Heart disease Mother    Asthma Sister    Cancer Father        mouth- never smoked   Colon cancer Neg Hx    Colon polyps Neg Hx    Esophageal cancer Neg Hx    Rectal cancer Neg Hx    Stomach cancer Neg Hx      Social History   Socioeconomic History   Marital status: Significant Other    Spouse name: Not on file   Number of children: Not on file   Years of education: Not on file   Highest education level: Not on file  Occupational History   Not on file  Tobacco Use   Smoking status: Never   Smokeless tobacco: Never  Vaping Use   Vaping status: Never Used  Substance and Sexual Activity   Alcohol use: Yes    Alcohol/week: 0.0  standard drinks of alcohol    Comment: socially   Drug use: No   Sexual activity: Not on file  Other Topics Concern   Not on file  Social History Narrative   Not on file   Social Drivers of Health   Financial Resource Strain: Not on file  Food Insecurity: Not on file  Transportation Needs: Not on file  Physical Activity: Not on file  Stress: Not on file  Social Connections: Not on file  Intimate Partner Violence: Not on file     Allergies  Allergen Reactions   Buspirone Other (See Comments)    Extreme drowsiness   Citalopram Other (See Comments)    Extreme drowsiness     Outpatient Medications Prior to Visit  Medication Sig Dispense Refill   Ginger, Zingiber  officinalis, (GINGER PO) Take by mouth.     Misc Natural Products (TURMERIC, CURCUMIN, PO) Take by mouth.     albuterol  (VENTOLIN  HFA) 108 (90 Base) MCG/ACT inhaler Inhale 1 puff into the lungs every 6 (six) hours as needed for wheezing or shortness of breath.      budesonide -formoterol  (SYMBICORT ) 160-4.5 MCG/ACT inhaler Inhale 2 puffs into the lungs 2 (two) times daily. 1 each 6   Coenzyme Q10 (Q-SORB CO Q-10) 100 MG capsule 300 mg daily.     EPINEPHrine 0.3 mg/0.3 mL IJ SOAJ injection Inject 0.3 mg into the muscle as needed for anaphylaxis.  0   estradiol (ESTRACE) 0.1 MG/GM vaginal cream Place 1 Applicatorful vaginally daily. Uses PRN     fluticasone (FLONASE ALLERGY RELIEF) 50 MCG/ACT nasal spray 1 spray in each nostril Nasally Once a day     folic acid (FOLVITE) 1 MG tablet Take 1 mg by mouth daily.  3   levocetirizine (XYZAL) 5 MG tablet Take 5 mg by mouth every evening.      Misc Natural Products (NEURIVA) CAPS daily     montelukast (SINGULAIR) 10 MG tablet Take 10 mg by mouth at bedtime.      NON FORMULARY Magnesium paste- applies at bedtime PRN     Potassium (POTASSIMIN PO) 1 tablet Orally Once a day for 30 day(s)     pravastatin (PRAVACHOL) 20 MG tablet Take 20 mg by mouth daily.     terbinafine  (LAMISIL ) 250 MG tablet PLEASE TAKE ONE A DAY X 7DAYS, REPEAT EVERY 4 WEEKS X 4 MONTHS 28 tablet 0   Vitamin D, Ergocalciferol, (DRISDOL) 50000 units CAPS capsule Take 50,000 Units by mouth every 7 (seven) days.   1   famotidine  (PEPCID ) 20 MG tablet Take 1 tablet (20 mg total) by mouth daily.     No facility-administered medications prior to visit.    Review of Systems  Constitutional:  Negative for chills, fever, malaise/fatigue and weight loss.  HENT:  Negative for congestion, sinus pain and sore throat.   Eyes: Negative.   Respiratory:  Negative for cough, hemoptysis, sputum production, shortness of breath and wheezing.   Cardiovascular:  Negative for chest pain, palpitations,  orthopnea, claudication and leg swelling.  Gastrointestinal:  Negative for abdominal pain, heartburn, nausea and vomiting.  Genitourinary: Negative.   Musculoskeletal:  Negative for joint pain and myalgias.  Skin:  Negative for rash.  Neurological:  Negative for weakness.  Endo/Heme/Allergies: Negative.   Psychiatric/Behavioral: Negative.        Objective:   Vitals:   04/22/24 0920 04/22/24 0921  BP: 118/84 118/84  Pulse: 73 73  SpO2: 95% 95%  Weight:  206 lb (93.4 kg)  Height:  5' 6 (1.676 m)    Physical Exam Constitutional:      General: She is not in acute distress.    Appearance: Normal appearance.  Eyes:     General: No scleral icterus.    Conjunctiva/sclera: Conjunctivae normal.  Cardiovascular:     Rate and Rhythm: Normal rate and regular rhythm.  Pulmonary:     Breath sounds: No wheezing, rhonchi or rales.  Musculoskeletal:     Right lower leg: No edema.     Left lower leg: No edema.  Skin:    General: Skin is warm and dry.  Neurological:     General: No focal deficit present.    CBC    Component Value Date/Time   WBC 6.9 02/05/2024 1633   RBC 4.72 02/05/2024 1633   HGB 14.0 02/05/2024 1633   HCT 42.4 02/05/2024 1633   PLT 200.0 02/05/2024 1633   MCV 89.8 02/05/2024 1633   MCH 28.4 01/14/2020 1843   MCHC 33.1 02/05/2024 1633   RDW 14.9 02/05/2024 1633   LYMPHSABS 1.8 02/05/2024 1633   MONOABS 0.4 02/05/2024 1633   EOSABS 0.7 02/05/2024 1633   BASOSABS 0.0 02/05/2024 1633     Chest imaging:  PFT:     No data to display          Labs:  Path:  Echo:  Heart Catheterization:       Assessment & Plan:   Moderate persistent asthma without complication - Plan: Pulmonary Function Test  Discussion: Ebony Jimenez is a 68 year old woman, never smoker with history of persistent asthma who returns to pulmonary clinic.   Asthma - continue symbicort  2 puffs twice daily, encouraged to use as directed - monitor number of flares and  consider future need for biologic therapy - Use albuterol  inhaler 1-2 puffs 4-6 hours - continue montelukast 10mg  daily - schedule PFTs   GERD - denies symptoms at this time, ok to stop pepcid .  Follow up in 6 months  Dorn Chill, MD [facility] Office: [phone]   See Amion for personal pager PCCM on call pager [phone] until 7pm. Please call Elink 7p-7a. [phone]     Current Outpatient Medications:    Ginger, Zingiber officinalis, (GINGER PO), Take by mouth., Disp: , Rfl:    Misc Natural Products (TURMERIC, CURCUMIN, PO), Take by mouth., Disp: , Rfl:    albuterol  (VENTOLIN  HFA) 108 (90 Base) MCG/ACT inhaler, Inhale 1 puff into the lungs every 6 (six) hours as needed for wheezing or shortness of breath. , Disp: , Rfl:    budesonide -formoterol  (SYMBICORT ) 160-4.5 MCG/ACT inhaler, Inhale 2 puffs into the lungs 2 (two) times daily., Disp: 1 each, Rfl: 6   Coenzyme Q10 (Q-SORB CO Q-10) 100 MG capsule, 300 mg daily., Disp: , Rfl:    EPINEPHrine 0.3 mg/0.3 mL IJ SOAJ injection, Inject 0.3 mg into the muscle as needed for anaphylaxis., Disp: , Rfl: 0   estradiol (ESTRACE) 0.1 MG/GM vaginal cream, Place 1 Applicatorful vaginally daily. Uses PRN, Disp: , Rfl:    fluticasone (FLONASE ALLERGY RELIEF) 50 MCG/ACT nasal spray, 1 spray in each nostril Nasally Once a day, Disp: , Rfl:    folic acid (FOLVITE) 1 MG tablet, Take 1 mg by mouth daily., Disp: , Rfl: 3   levocetirizine (XYZAL) 5 MG tablet, Take 5 mg by mouth every evening. , Disp: , Rfl:    Misc Natural Products (NEURIVA) CAPS, daily, Disp: , Rfl:    montelukast (SINGULAIR)  10 MG tablet, Take 10 mg by mouth at bedtime. , Disp: , Rfl:    NON FORMULARY, Magnesium paste- applies at bedtime PRN, Disp: , Rfl:    Potassium (POTASSIMIN PO), 1 tablet Orally Once a day for 30 day(s), Disp: , Rfl:    pravastatin (PRAVACHOL) 20 MG tablet, Take 20 mg by mouth daily., Disp: , Rfl:    terbinafine  (LAMISIL )  250 MG tablet, PLEASE TAKE ONE A DAY X 7DAYS, REPEAT EVERY 4 WEEKS X 4 MONTHS, Disp: 28 tablet, Rfl: 0   Vitamin D, Ergocalciferol, (DRISDOL) 50000 units CAPS capsule, Take 50,000 Units by mouth every 7 (seven) days. , Disp: , Rfl: 1

## 2024-04-30 DIAGNOSIS — J3089 Other allergic rhinitis: Secondary | ICD-10-CM | POA: Diagnosis not present

## 2024-04-30 DIAGNOSIS — J3081 Allergic rhinitis due to animal (cat) (dog) hair and dander: Secondary | ICD-10-CM | POA: Diagnosis not present

## 2024-04-30 DIAGNOSIS — J301 Allergic rhinitis due to pollen: Secondary | ICD-10-CM | POA: Diagnosis not present

## 2024-05-06 DIAGNOSIS — J301 Allergic rhinitis due to pollen: Secondary | ICD-10-CM | POA: Diagnosis not present

## 2024-05-06 DIAGNOSIS — J3081 Allergic rhinitis due to animal (cat) (dog) hair and dander: Secondary | ICD-10-CM | POA: Diagnosis not present

## 2024-05-06 DIAGNOSIS — J3089 Other allergic rhinitis: Secondary | ICD-10-CM | POA: Diagnosis not present

## 2024-05-13 ENCOUNTER — Encounter

## 2024-05-14 DIAGNOSIS — J3081 Allergic rhinitis due to animal (cat) (dog) hair and dander: Secondary | ICD-10-CM | POA: Diagnosis not present

## 2024-05-14 DIAGNOSIS — J4540 Moderate persistent asthma, uncomplicated: Secondary | ICD-10-CM | POA: Diagnosis not present

## 2024-05-14 DIAGNOSIS — J301 Allergic rhinitis due to pollen: Secondary | ICD-10-CM | POA: Diagnosis not present

## 2024-05-14 DIAGNOSIS — J3089 Other allergic rhinitis: Secondary | ICD-10-CM | POA: Diagnosis not present

## 2024-05-14 DIAGNOSIS — F331 Major depressive disorder, recurrent, moderate: Secondary | ICD-10-CM | POA: Diagnosis not present

## 2024-05-14 DIAGNOSIS — E7800 Pure hypercholesterolemia, unspecified: Secondary | ICD-10-CM | POA: Diagnosis not present

## 2024-05-14 DIAGNOSIS — J4541 Moderate persistent asthma with (acute) exacerbation: Secondary | ICD-10-CM | POA: Diagnosis not present

## 2024-05-15 DIAGNOSIS — J3089 Other allergic rhinitis: Secondary | ICD-10-CM | POA: Diagnosis not present

## 2024-05-15 DIAGNOSIS — J3081 Allergic rhinitis due to animal (cat) (dog) hair and dander: Secondary | ICD-10-CM | POA: Diagnosis not present

## 2024-05-15 DIAGNOSIS — J301 Allergic rhinitis due to pollen: Secondary | ICD-10-CM | POA: Diagnosis not present

## 2024-05-22 DIAGNOSIS — J301 Allergic rhinitis due to pollen: Secondary | ICD-10-CM | POA: Diagnosis not present

## 2024-05-22 DIAGNOSIS — J3089 Other allergic rhinitis: Secondary | ICD-10-CM | POA: Diagnosis not present

## 2024-05-22 DIAGNOSIS — J3081 Allergic rhinitis due to animal (cat) (dog) hair and dander: Secondary | ICD-10-CM | POA: Diagnosis not present

## 2024-05-27 DIAGNOSIS — J3081 Allergic rhinitis due to animal (cat) (dog) hair and dander: Secondary | ICD-10-CM | POA: Diagnosis not present

## 2024-05-27 DIAGNOSIS — J301 Allergic rhinitis due to pollen: Secondary | ICD-10-CM | POA: Diagnosis not present

## 2024-05-27 DIAGNOSIS — J3089 Other allergic rhinitis: Secondary | ICD-10-CM | POA: Diagnosis not present

## 2024-06-02 DIAGNOSIS — J3081 Allergic rhinitis due to animal (cat) (dog) hair and dander: Secondary | ICD-10-CM | POA: Diagnosis not present

## 2024-06-02 DIAGNOSIS — J301 Allergic rhinitis due to pollen: Secondary | ICD-10-CM | POA: Diagnosis not present

## 2024-06-02 DIAGNOSIS — L821 Other seborrheic keratosis: Secondary | ICD-10-CM | POA: Diagnosis not present

## 2024-06-02 DIAGNOSIS — R233 Spontaneous ecchymoses: Secondary | ICD-10-CM | POA: Diagnosis not present

## 2024-06-02 DIAGNOSIS — J3089 Other allergic rhinitis: Secondary | ICD-10-CM | POA: Diagnosis not present

## 2024-06-02 DIAGNOSIS — L304 Erythema intertrigo: Secondary | ICD-10-CM | POA: Diagnosis not present

## 2024-06-02 DIAGNOSIS — L918 Other hypertrophic disorders of the skin: Secondary | ICD-10-CM | POA: Diagnosis not present

## 2024-06-08 DIAGNOSIS — J301 Allergic rhinitis due to pollen: Secondary | ICD-10-CM | POA: Diagnosis not present

## 2024-06-08 DIAGNOSIS — L538 Other specified erythematous conditions: Secondary | ICD-10-CM | POA: Diagnosis not present

## 2024-06-08 DIAGNOSIS — L820 Inflamed seborrheic keratosis: Secondary | ICD-10-CM | POA: Diagnosis not present

## 2024-06-08 DIAGNOSIS — J3089 Other allergic rhinitis: Secondary | ICD-10-CM | POA: Diagnosis not present

## 2024-06-08 DIAGNOSIS — D492 Neoplasm of unspecified behavior of bone, soft tissue, and skin: Secondary | ICD-10-CM | POA: Diagnosis not present

## 2024-06-08 DIAGNOSIS — J3081 Allergic rhinitis due to animal (cat) (dog) hair and dander: Secondary | ICD-10-CM | POA: Diagnosis not present

## 2024-06-10 DIAGNOSIS — R0781 Pleurodynia: Secondary | ICD-10-CM | POA: Diagnosis not present

## 2024-06-14 DIAGNOSIS — E7800 Pure hypercholesterolemia, unspecified: Secondary | ICD-10-CM | POA: Diagnosis not present

## 2024-06-14 DIAGNOSIS — J4541 Moderate persistent asthma with (acute) exacerbation: Secondary | ICD-10-CM | POA: Diagnosis not present

## 2024-06-14 DIAGNOSIS — F331 Major depressive disorder, recurrent, moderate: Secondary | ICD-10-CM | POA: Diagnosis not present

## 2024-06-14 DIAGNOSIS — J4540 Moderate persistent asthma, uncomplicated: Secondary | ICD-10-CM | POA: Diagnosis not present

## 2024-06-18 DIAGNOSIS — J301 Allergic rhinitis due to pollen: Secondary | ICD-10-CM | POA: Diagnosis not present

## 2024-06-18 DIAGNOSIS — J3089 Other allergic rhinitis: Secondary | ICD-10-CM | POA: Diagnosis not present

## 2024-06-18 DIAGNOSIS — J3081 Allergic rhinitis due to animal (cat) (dog) hair and dander: Secondary | ICD-10-CM | POA: Diagnosis not present

## 2024-06-29 DIAGNOSIS — J3081 Allergic rhinitis due to animal (cat) (dog) hair and dander: Secondary | ICD-10-CM | POA: Diagnosis not present

## 2024-06-29 DIAGNOSIS — J3089 Other allergic rhinitis: Secondary | ICD-10-CM | POA: Diagnosis not present

## 2024-06-29 DIAGNOSIS — J301 Allergic rhinitis due to pollen: Secondary | ICD-10-CM | POA: Diagnosis not present

## 2024-07-08 DIAGNOSIS — E559 Vitamin D deficiency, unspecified: Secondary | ICD-10-CM | POA: Diagnosis not present

## 2024-07-08 DIAGNOSIS — E7800 Pure hypercholesterolemia, unspecified: Secondary | ICD-10-CM | POA: Diagnosis not present

## 2024-07-08 DIAGNOSIS — J301 Allergic rhinitis due to pollen: Secondary | ICD-10-CM | POA: Diagnosis not present

## 2024-07-08 DIAGNOSIS — J3089 Other allergic rhinitis: Secondary | ICD-10-CM | POA: Diagnosis not present

## 2024-07-08 DIAGNOSIS — Z79899 Other long term (current) drug therapy: Secondary | ICD-10-CM | POA: Diagnosis not present

## 2024-07-08 DIAGNOSIS — J3081 Allergic rhinitis due to animal (cat) (dog) hair and dander: Secondary | ICD-10-CM | POA: Diagnosis not present

## 2024-07-08 DIAGNOSIS — J4540 Moderate persistent asthma, uncomplicated: Secondary | ICD-10-CM | POA: Diagnosis not present

## 2024-07-08 DIAGNOSIS — M8589 Other specified disorders of bone density and structure, multiple sites: Secondary | ICD-10-CM | POA: Diagnosis not present

## 2024-07-08 DIAGNOSIS — Z0000 Encounter for general adult medical examination without abnormal findings: Secondary | ICD-10-CM | POA: Diagnosis not present

## 2024-07-08 DIAGNOSIS — T7840XA Allergy, unspecified, initial encounter: Secondary | ICD-10-CM | POA: Diagnosis not present

## 2024-07-08 DIAGNOSIS — Z23 Encounter for immunization: Secondary | ICD-10-CM | POA: Diagnosis not present

## 2024-07-09 DIAGNOSIS — Z79899 Other long term (current) drug therapy: Secondary | ICD-10-CM | POA: Diagnosis not present

## 2024-07-09 DIAGNOSIS — E7800 Pure hypercholesterolemia, unspecified: Secondary | ICD-10-CM | POA: Diagnosis not present

## 2024-07-09 DIAGNOSIS — E559 Vitamin D deficiency, unspecified: Secondary | ICD-10-CM | POA: Diagnosis not present

## 2024-07-14 DIAGNOSIS — J4541 Moderate persistent asthma with (acute) exacerbation: Secondary | ICD-10-CM | POA: Diagnosis not present

## 2024-07-14 DIAGNOSIS — E7800 Pure hypercholesterolemia, unspecified: Secondary | ICD-10-CM | POA: Diagnosis not present

## 2024-07-14 DIAGNOSIS — J4540 Moderate persistent asthma, uncomplicated: Secondary | ICD-10-CM | POA: Diagnosis not present

## 2024-07-14 DIAGNOSIS — F331 Major depressive disorder, recurrent, moderate: Secondary | ICD-10-CM | POA: Diagnosis not present

## 2024-07-24 DIAGNOSIS — J3089 Other allergic rhinitis: Secondary | ICD-10-CM | POA: Diagnosis not present

## 2024-07-24 DIAGNOSIS — J3081 Allergic rhinitis due to animal (cat) (dog) hair and dander: Secondary | ICD-10-CM | POA: Diagnosis not present

## 2024-07-24 DIAGNOSIS — J4540 Moderate persistent asthma, uncomplicated: Secondary | ICD-10-CM | POA: Diagnosis not present

## 2024-07-24 DIAGNOSIS — J301 Allergic rhinitis due to pollen: Secondary | ICD-10-CM | POA: Diagnosis not present

## 2024-07-29 DIAGNOSIS — J3089 Other allergic rhinitis: Secondary | ICD-10-CM | POA: Diagnosis not present

## 2024-07-29 DIAGNOSIS — J3081 Allergic rhinitis due to animal (cat) (dog) hair and dander: Secondary | ICD-10-CM | POA: Diagnosis not present

## 2024-07-29 DIAGNOSIS — J301 Allergic rhinitis due to pollen: Secondary | ICD-10-CM | POA: Diagnosis not present

## 2024-08-14 DIAGNOSIS — F331 Major depressive disorder, recurrent, moderate: Secondary | ICD-10-CM | POA: Diagnosis not present

## 2024-08-14 DIAGNOSIS — J4541 Moderate persistent asthma with (acute) exacerbation: Secondary | ICD-10-CM | POA: Diagnosis not present

## 2024-08-14 DIAGNOSIS — E7800 Pure hypercholesterolemia, unspecified: Secondary | ICD-10-CM | POA: Diagnosis not present

## 2024-08-14 DIAGNOSIS — J4540 Moderate persistent asthma, uncomplicated: Secondary | ICD-10-CM | POA: Diagnosis not present

## 2024-08-17 DIAGNOSIS — J4520 Mild intermittent asthma, uncomplicated: Secondary | ICD-10-CM | POA: Diagnosis not present

## 2024-08-17 DIAGNOSIS — R051 Acute cough: Secondary | ICD-10-CM | POA: Diagnosis not present

## 2024-08-17 DIAGNOSIS — J40 Bronchitis, not specified as acute or chronic: Secondary | ICD-10-CM | POA: Diagnosis not present

## 2024-08-19 DIAGNOSIS — M8589 Other specified disorders of bone density and structure, multiple sites: Secondary | ICD-10-CM | POA: Diagnosis not present

## 2024-08-20 DIAGNOSIS — R051 Acute cough: Secondary | ICD-10-CM | POA: Diagnosis not present

## 2024-08-20 DIAGNOSIS — R062 Wheezing: Secondary | ICD-10-CM | POA: Diagnosis not present

## 2024-08-20 DIAGNOSIS — Z8709 Personal history of other diseases of the respiratory system: Secondary | ICD-10-CM | POA: Diagnosis not present

## 2024-08-20 DIAGNOSIS — Z03818 Encounter for observation for suspected exposure to other biological agents ruled out: Secondary | ICD-10-CM | POA: Diagnosis not present

## 2024-08-20 DIAGNOSIS — J209 Acute bronchitis, unspecified: Secondary | ICD-10-CM | POA: Diagnosis not present

## 2024-08-20 DIAGNOSIS — J101 Influenza due to other identified influenza virus with other respiratory manifestations: Secondary | ICD-10-CM | POA: Diagnosis not present

## 2024-08-20 DIAGNOSIS — R0981 Nasal congestion: Secondary | ICD-10-CM | POA: Diagnosis not present

## 2024-09-03 DIAGNOSIS — J3089 Other allergic rhinitis: Secondary | ICD-10-CM | POA: Diagnosis not present

## 2024-09-03 DIAGNOSIS — J3081 Allergic rhinitis due to animal (cat) (dog) hair and dander: Secondary | ICD-10-CM | POA: Diagnosis not present

## 2024-09-03 DIAGNOSIS — J301 Allergic rhinitis due to pollen: Secondary | ICD-10-CM | POA: Diagnosis not present

## 2024-09-08 DIAGNOSIS — J3081 Allergic rhinitis due to animal (cat) (dog) hair and dander: Secondary | ICD-10-CM | POA: Diagnosis not present

## 2024-09-08 DIAGNOSIS — J3089 Other allergic rhinitis: Secondary | ICD-10-CM | POA: Diagnosis not present

## 2024-09-08 DIAGNOSIS — J301 Allergic rhinitis due to pollen: Secondary | ICD-10-CM | POA: Diagnosis not present

## 2024-09-15 DIAGNOSIS — J301 Allergic rhinitis due to pollen: Secondary | ICD-10-CM | POA: Diagnosis not present

## 2024-09-15 DIAGNOSIS — J3081 Allergic rhinitis due to animal (cat) (dog) hair and dander: Secondary | ICD-10-CM | POA: Diagnosis not present

## 2024-09-15 DIAGNOSIS — J3089 Other allergic rhinitis: Secondary | ICD-10-CM | POA: Diagnosis not present

## 2024-09-16 ENCOUNTER — Ambulatory Visit

## 2024-09-16 ENCOUNTER — Ambulatory Visit: Admitting: Pulmonary Disease

## 2024-09-16 ENCOUNTER — Encounter: Payer: Self-pay | Admitting: Pulmonary Disease

## 2024-09-16 VITALS — BP 130/88 | HR 83 | Ht 66.0 in | Wt 204.0 lb

## 2024-09-16 DIAGNOSIS — J4540 Moderate persistent asthma, uncomplicated: Secondary | ICD-10-CM

## 2024-09-16 LAB — PULMONARY FUNCTION TEST
DL/VA % pred: 108 %
DL/VA: 4.44 ml/min/mmHg/L
DLCO unc % pred: 85 %
DLCO unc: 18.09 ml/min/mmHg
FEF 25-75 Pre: 1.66 L/s
FEF2575-%Pred-Pre: 79 %
FEV1-%Pred-Pre: 84 %
FEV1-Pre: 2.12 L
FEV1FVC-%Pred-Pre: 99 %
FEV6-%Pred-Pre: 86 %
FEV6-Pre: 2.75 L
FEV6FVC-%Pred-Pre: 102 %
FVC-%Pred-Pre: 84 %
FVC-Pre: 2.8 L
Pre FEV1/FVC ratio: 76 %
Pre FEV6/FVC Ratio: 98 %
RV % pred: 90 %
RV: 2.06 L
TLC % pred: 88 %
TLC: 4.78 L

## 2024-09-16 MED ORDER — MONTELUKAST SODIUM 10 MG PO TABS: 10.0000 mg | ORAL_TABLET | Freq: Every day | ORAL | 11 refills | Status: AC

## 2024-09-16 MED ORDER — ALBUTEROL SULFATE HFA 108 (90 BASE) MCG/ACT IN AERS: 1.0000 | INHALATION_SPRAY | Freq: Four times a day (QID) | RESPIRATORY_TRACT | 6 refills | Status: AC | PRN

## 2024-09-16 MED ORDER — BUDESONIDE-FORMOTEROL FUMARATE 160-4.5 MCG/ACT IN AERO: 2.0000 | INHALATION_SPRAY | Freq: Two times a day (BID) | RESPIRATORY_TRACT | 11 refills | Status: AC

## 2024-09-16 NOTE — Patient Instructions (Addendum)
 Continue symbicort  2 puffs twice daily - rinse mouth out after each use  Use albuterol  inhaler 1-2 puffs every 4-6 hours as needed  Continue montelukast 10mg  daily  Your breathing tests are within normal limits  Follow up in 1 year, call sooner if needed

## 2024-09-16 NOTE — Assessment & Plan Note (Addendum)
  Orders:   albuterol  (VENTOLIN  HFA) 108 (90 Base) MCG/ACT inhaler; Inhale 1 puff into the lungs every 6 (six) hours as needed for wheezing or shortness of breath.   budesonide -formoterol  (SYMBICORT ) 160-4.5 MCG/ACT inhaler; Inhale 2 puffs into the lungs 2 (two) times daily.   montelukast (SINGULAIR) 10 MG tablet; Take 1 tablet (10 mg total) by mouth at bedtime.

## 2024-09-16 NOTE — Progress Notes (Signed)
 Established Patient Pulmonology Office Visit   Subjective:  Patient ID: Ebony Jimenez, female    DOB: 1956/06/08  MRN: [id number]  CC:  Chief Complaint  Patient presents with   Medical Management of Chronic Issues    Pt states post PFT    Discussed the use of AI scribe software for clinical note transcription with the patient, who gave verbal consent to proceed.  History of Present Illness Ebony Jimenez is a 68 year old female with persistent asthma who presents for a follow-up visit.  She uses Symbicort  160-4.5 mcg two puffs twice daily, albuterol  as needed, and montelukast  10 mg daily for asthma control.  She recently had influenza treated with an antibiotic, a short prednisone  course, and Tamiflu, along with albuterol  for cough. Symptoms improved, but she feels shorter prednisone  courses do not fully resolve her asthma symptoms, unlike a prior 12-day prednisone  course in February that controlled her symptoms better.  She has history of osteopenia based on Dexa scan from 2023.       ROS    Current Outpatient Medications:    Coenzyme Q10 (Q-SORB CO Q-10) 100 MG capsule, 300 mg daily., Disp: , Rfl:    EPINEPHrine 0.3 mg/0.3 mL IJ SOAJ injection, Inject 0.3 mg into the muscle as needed for anaphylaxis., Disp: , Rfl: 0   estradiol (ESTRACE) 0.1 MG/GM vaginal cream, Place 1 Applicatorful vaginally daily. Uses PRN, Disp: , Rfl:    fluticasone (FLONASE ALLERGY RELIEF) 50 MCG/ACT nasal spray, 1 spray in each nostril Nasally Once a day, Disp: , Rfl:    folic acid (FOLVITE) 1 MG tablet, Take 1 mg by mouth daily., Disp: , Rfl: 3   Ginger, Zingiber officinalis, (GINGER PO), Take by mouth., Disp: , Rfl:    levocetirizine (XYZAL) 5 MG tablet, Take 5 mg by mouth every evening. , Disp: , Rfl:    Misc Natural Products (NEURIVA) CAPS, daily, Disp: , Rfl:    Misc Natural Products (TURMERIC, CURCUMIN, PO), Take by mouth., Disp: , Rfl:    NON FORMULARY, Magnesium paste- applies at bedtime  PRN, Disp: , Rfl:    Potassium (POTASSIMIN PO), 1 tablet Orally Once a day for 30 day(s), Disp: , Rfl:    pravastatin (PRAVACHOL) 20 MG tablet, Take 20 mg by mouth daily., Disp: , Rfl:    terbinafine  (LAMISIL ) 250 MG tablet, PLEASE TAKE ONE A DAY X 7DAYS, REPEAT EVERY 4 WEEKS X 4 MONTHS, Disp: 28 tablet, Rfl: 0   Vitamin D, Ergocalciferol, (DRISDOL) 50000 units CAPS capsule, Take 50,000 Units by mouth every 7 (seven) days. , Disp: , Rfl: 1   albuterol  (VENTOLIN  HFA) 108 (90 Base) MCG/ACT inhaler, Inhale 1 puff into the lungs every 6 (six) hours as needed for wheezing or shortness of breath., Disp: 8 g, Rfl: 6   budesonide -formoterol  (SYMBICORT ) 160-4.5 MCG/ACT inhaler, Inhale 2 puffs into the lungs 2 (two) times daily., Disp: 1 each, Rfl: 11   montelukast  (SINGULAIR ) 10 MG tablet, Take 1 tablet (10 mg total) by mouth at bedtime., Disp: 30 tablet, Rfl: 11      Objective:  BP 130/88   Pulse 83   Ht 5' 6 (1.676 m) Comment: per RRT notes  Wt 204 lb (92.5 kg)   LMP 06/30/2011   SpO2 95%   BMI 32.93 kg/m     Physical Exam Constitutional:      General: She is not in acute distress.    Appearance: Normal appearance.  Eyes:  General: No scleral icterus.    Conjunctiva/sclera: Conjunctivae normal.  Cardiovascular:     Rate and Rhythm: Normal rate and regular rhythm.  Pulmonary:     Breath sounds: No wheezing, rhonchi or rales.  Musculoskeletal:     Right lower leg: No edema.     Left lower leg: No edema.  Skin:    General: Skin is warm and dry.  Neurological:     General: No focal deficit present.      Diagnostic Review:       Assessment & Plan:   Assessment & Plan Moderate persistent asthma without complication  Orders:   albuterol  (VENTOLIN  HFA) 108 (90 Base) MCG/ACT inhaler; Inhale 1 puff into the lungs every 6 (six) hours as needed for wheezing or shortness of breath.   budesonide -formoterol  (SYMBICORT ) 160-4.5 MCG/ACT inhaler; Inhale 2 puffs into the lungs 2  (two) times daily.   montelukast (SINGULAIR) 10 MG tablet; Take 1 tablet (10 mg total) by mouth at bedtime.   Assessment and Plan Assessment & Plan Persistent moderate asthma Well-controlled with current regimen. Normal spirometry, lung volumes, and diffusion capacity. Recent illness managed effectively. Discussed longer prednisone  courses and associated risks. - Continue Symbicort  160-4.5 mcg, two puffs twice daily. - Use albuterol  as needed 1-2 puffs every 4-6 hours - Provided refills for Symbicort , montelukast, and albuterol . - Advised to call if there are changes in breathing.      Return in about 1 year (around 09/16/2025) for f/u visit Dr. Kara.   Dorn KATHEE Kara, MD

## 2024-09-16 NOTE — Progress Notes (Signed)
 Full pft w/o post performed due to patient checking in late. Provider aware.

## 2024-09-16 NOTE — Patient Instructions (Signed)
 Full pft w/o post performed due to patient checking in late. Provider aware.

## 2024-09-17 DIAGNOSIS — Z79899 Other long term (current) drug therapy: Secondary | ICD-10-CM | POA: Diagnosis not present

## 2024-10-23 ENCOUNTER — Other Ambulatory Visit: Payer: Self-pay

## 2024-10-23 ENCOUNTER — Emergency Department (HOSPITAL_BASED_OUTPATIENT_CLINIC_OR_DEPARTMENT_OTHER): Admitting: Radiology

## 2024-10-23 ENCOUNTER — Emergency Department (HOSPITAL_BASED_OUTPATIENT_CLINIC_OR_DEPARTMENT_OTHER)
Admission: EM | Admit: 2024-10-23 | Discharge: 2024-10-23 | Disposition: A | Discharge status: Home / Self Care | Attending: Emergency Medicine | Admitting: Emergency Medicine

## 2024-10-23 ENCOUNTER — Encounter (HOSPITAL_BASED_OUTPATIENT_CLINIC_OR_DEPARTMENT_OTHER): Payer: Self-pay | Admitting: *Deleted

## 2024-10-23 DIAGNOSIS — Z79899 Other long term (current) drug therapy: Secondary | ICD-10-CM | POA: Diagnosis not present

## 2024-10-23 DIAGNOSIS — R198 Other specified symptoms and signs involving the digestive system and abdomen: Secondary | ICD-10-CM | POA: Diagnosis not present

## 2024-10-23 DIAGNOSIS — E039 Hypothyroidism, unspecified: Secondary | ICD-10-CM | POA: Diagnosis not present

## 2024-10-23 DIAGNOSIS — J45909 Unspecified asthma, uncomplicated: Secondary | ICD-10-CM | POA: Insufficient documentation

## 2024-10-23 DIAGNOSIS — R109 Unspecified abdominal pain: Secondary | ICD-10-CM | POA: Diagnosis present

## 2024-10-23 DIAGNOSIS — Z79890 Hormone replacement therapy (postmenopausal): Secondary | ICD-10-CM | POA: Diagnosis not present

## 2024-10-23 LAB — COMPREHENSIVE METABOLIC PANEL WITH GFR
ALT: 13 U/L (ref 0–44)
AST: 20 U/L (ref 15–41)
Albumin: 4.1 g/dL (ref 3.5–5.0)
Alkaline Phosphatase: 81 U/L (ref 38–126)
Anion gap: 10 (ref 5–15)
BUN: 15 mg/dL (ref 8–23)
CO2: 25 mmol/L (ref 22–32)
Calcium: 9.8 mg/dL (ref 8.9–10.3)
Chloride: 104 mmol/L (ref 98–111)
Creatinine, Ser: 0.94 mg/dL (ref 0.44–1.00)
GFR, Estimated: >60 mL/min
Glucose, Bld: 109 mg/dL — ABNORMAL HIGH (ref 70–99)
Potassium: 4.3 mmol/L (ref 3.5–5.1)
Sodium: 140 mmol/L (ref 135–145)
Total Bilirubin: 0.7 mg/dL (ref 0.0–1.2)
Total Protein: 7 g/dL (ref 6.5–8.1)

## 2024-10-23 LAB — CBC WITH DIFFERENTIAL/PLATELET
Abs Immature Granulocytes: 0.03 K/uL (ref 0.00–0.07)
Basophils Absolute: 0 K/uL (ref 0.0–0.1)
Basophils Relative: 0 %
Eosinophils Absolute: 0.5 K/uL (ref 0.0–0.5)
Eosinophils Relative: 6 %
HCT: 41.1 % (ref 36.0–46.0)
Hemoglobin: 13.4 g/dL (ref 12.0–15.0)
Immature Granulocytes: 0 %
Lymphocytes Relative: 19 %
Lymphs Abs: 1.7 K/uL (ref 0.7–4.0)
MCH: 28.9 pg (ref 26.0–34.0)
MCHC: 32.6 g/dL (ref 30.0–36.0)
MCV: 88.6 fL (ref 80.0–100.0)
Monocytes Absolute: 0.6 K/uL (ref 0.1–1.0)
Monocytes Relative: 7 %
Neutro Abs: 6.1 K/uL (ref 1.7–7.7)
Neutrophils Relative %: 68 %
Platelets: 221 K/uL (ref 150–400)
RBC: 4.64 MIL/uL (ref 3.87–5.11)
RDW: 15.3 % (ref 11.5–15.5)
WBC: 9 K/uL (ref 4.0–10.5)
nRBC: 0 % (ref 0.0–0.2)

## 2024-10-23 LAB — LIPASE, BLOOD: Lipase: 41 U/L (ref 11–51)

## 2024-10-23 LAB — TSH: TSH: 10.6 u[IU]/mL — ABNORMAL HIGH (ref 0.350–4.500)

## 2024-10-23 LAB — T4, FREE: Free T4: 0.91 ng/dL (ref 0.80–2.00)

## 2024-10-23 MED ORDER — DICYCLOMINE HCL 10 MG PO CAPS
10.0000 mg | ORAL_CAPSULE | Freq: Once | ORAL | Status: AC
  Administered 2024-10-23: 10 mg via ORAL
  Filled 2024-10-23: qty 1

## 2024-10-23 MED ORDER — LACTATED RINGERS IV BOLUS
1000.0000 mL | Freq: Once | INTRAVENOUS | Status: AC
  Administered 2024-10-23: 1000 mL via INTRAVENOUS

## 2024-10-23 NOTE — Discharge Instructions (Signed)
 Ebony Jimenez  Thank you for allowing us  to take care of you today.  You came to the Emergency Department today because you previously had regular formed bowel movements, and started on levothyroxine you have had loose bowel movements and these bowel movements have been unsatisfactory in character to you, therefore you were concern for constipation.  You did not have improvement with 4 days of MiraLAX.  Here in the emergency department your labs are reassuring, your T4 level is normal which means that your thyroxine is working appropriately.  You should further follow-up your thyroid  with your primary care doctor, however there are no emergent changes that you need to have done today.  Your x-ray of your belly does not show severe constipation, it shows a small amount of stool.  You are likely not having large bowel movements because you do not have a large amount of stool in your colon.  You can continue to use the MiraLAX daily, or you can use the instructions below to do a MiraLAX cleanout (the same as a colonoscopy prep) to reset your colon.  We will give you referral to follow back up with a GI doctor for any further concerns you have regarding your bowel movements, as GI doctors are specialist in the GI system including the colon.  To-Do: 1. Please follow-up with your primary doctor within 1 - 2 weeks / as soon as possible.    Please return to the Emergency Department or call 911 if you experience have worsening of your symptoms, or do not get better, chest pain, shortness of breath, severe or significantly worsening pain, high fever, severe confusion, pass out or have any reason to think that you need emergency medical care.   We hope you feel better soon.   [name], MD Department of Emergency Medicine [facility]  Key Steps & What You'll Need Medication: One large (238g) bottle of MiraLAX powder.  Liquid: 64 oz (about 2 liters) of clear liquids (Gatorade, white  grape juice, broth, water, Jell-O, etc.).  Avoid: Red, orange, and purple liquids; solid foods; alcohol.  Prep Day Diet: Clear liquid diet only (no solids).  Timing: Start in the afternoon of a day where you anticipate having the next 48-72 hours free (for example, start on Friday before a weekend where you do not have obligations).  The Process Mix: Dissolve the entire bottle of MiraLAX into the 64 oz of clear liquid. Chill if desired.  Drink: Start drinking 8-ounce servings of the solution every 15-30 minutes until it's all gone.  Hydrate: Drink additional clear liquids (water, broth) throughout the day to prevent dehydration.  Expect: Frequent, watery, clear or yellowish stools.  Soothe: Use zinc oxide or Desitin to protect your skin.  Tips for Success Stay Close to a Toilet: This process causes intense diarrhea.  Don't Get Nauseous: Slow down if you feel full or sick, taking smaller amounts.  Follow Your Doctor's Orders: Instructions can vary slightly, so always prioritize what your provider gave you.

## 2024-10-23 NOTE — ED Triage Notes (Addendum)
 Pt states she has had constipation issues since mid December. States cramping on and off. States she has had tiny amount' of stool. C/o general abd pain. Has taken Miralax the past 3 days and a glycerin suppository with not significant results. Pt recently diagnosed with hypothyroid-was placed on meds in December. Denies any urinary symptoms. Pt had a near syncopal episode while IV was being inserted and blood drawn. Skin color was pale. Pt was taken to a room and placed on stretcher. Color improved, but states she still feels a little dizzy. EKG obtained.

## 2024-10-23 NOTE — ED Provider Notes (Signed)
 " [facility] AT [facility] Provider Note   CSN: [id number] Arrival date & time: 10/23/24  0601     History Chief Complaint  Patient presents with   Abdominal Pain    HPI: Ebony RODRIGES is a 69 y.o. female with history pertinent for HLD, asthma, hypothyroidism who presents complaining of sensation that she is not adequately evacuating her bowels. Patient arrived via POV.  History provided by patient.  No interpreter required during this encounter.  Patient reports that she was recently diagnosed with hypothyroidism in December 2025.  Reports that she went to see her doctor, and states that she was told that she should have been on medication for a couple of months, however this had not been prescribed, therefore an December she was started on levothyroxine, she believes at 25 mcg.  Reports that previous to starting levothyroxine that she had regular formed bowel movements, and after starting the levothyroxine she had several days of loose stools, and since then she has felt that she has had incomplete evacuation of her bowels.  Reports that she is able to pass a small amount of loose stool every day, however has not had a satisfactory bowel movement in approximately 2 weeks.  Reports that she discussed this with her primary care doctor who recommended that she initiate MiraLAX.  Reports that she has taken the 17 g once daily for total of 4 days, however has not had significant change in her symptoms.  Reports that she has occasional abdominal cramping, denies any fevers, chills, chest pain, shortness of breath.  Reports that she had a near syncopal event while having her blood drawn, reports that she has a history of syncope and near syncope her younger years.  Reports that she was feeling slightly dizzy, now improved.  Patient's recorded medical, surgical, social, medication list and allergies were reviewed in the Snapshot window as part of the initial history.    Prior to Admission medications  Medication Sig Start Date End Date Taking? Authorizing Provider  levothyroxine (SYNTHROID) 25 MCG tablet Take 25 mcg by mouth every morning. 09/25/24  Yes [provider]  albuterol  (VENTOLIN  HFA) 108 (90 Base) MCG/ACT inhaler Inhale 1 puff into the lungs every 6 (six) hours as needed for wheezing or shortness of breath. 09/16/24   Kara Dorn NOVAK, MD  budesonide -formoterol  (SYMBICORT ) 160-4.5 MCG/ACT inhaler Inhale 2 puffs into the lungs 2 (two) times daily. 09/16/24   Kara Dorn NOVAK, MD  Coenzyme Q10 (Q-SORB CO Q-10) 100 MG capsule 300 mg daily.    [provider]  EPINEPHrine 0.3 mg/0.3 mL IJ SOAJ injection Inject 0.3 mg into the muscle as needed for anaphylaxis. 01/07/18   [provider]  estradiol (ESTRACE) 0.1 MG/GM vaginal cream Place 1 Applicatorful vaginally daily. Uses PRN    [provider]  fluticasone (FLONASE ALLERGY RELIEF) 50 MCG/ACT nasal spray 1 spray in each nostril Nasally Once a day    [provider]  folic acid (FOLVITE) 1 MG tablet Take 1 mg by mouth daily. 12/04/17   [provider]  Ginger, Zingiber officinalis, (GINGER PO) Take by mouth.    [provider]  levocetirizine (XYZAL) 5 MG tablet Take 5 mg by mouth every evening.  03/20/19   [provider]  Misc Natural Products (NEURIVA) CAPS daily 11/23/21   [provider]  Misc Natural Products (TURMERIC, CURCUMIN, PO) Take by mouth.    [provider]  montelukast  (SINGULAIR ) 10 MG tablet Take  1 tablet (10 mg total) by mouth at bedtime. 09/16/24   Kara Dorn NOVAK, MD  NON FORMULARY Magnesium paste- applies at bedtime PRN    [provider]  Potassium (POTASSIMIN PO) 1 tablet Orally Once a day for 30 day(s)    [provider]  pravastatin (PRAVACHOL) 20 MG tablet Take 20 mg by mouth daily.    [provider]  terbinafine  (LAMISIL ) 250 MG tablet PLEASE TAKE ONE A DAY X  7DAYS, REPEAT EVERY 4 WEEKS X 4 MONTHS 04/30/19   Magdalen Pasco RAMAN, DPM  Vitamin D, Ergocalciferol, (DRISDOL) 50000 units CAPS capsule Take 50,000 Units by mouth every 7 (seven) days.  01/24/18   [provider]     Allergies: Buspirone and Citalopram   Review of Systems   ROS as per HPI  Physical Exam Updated Vital Signs BP 119/83   Pulse 71   Temp 98.1 F (36.7 C)   Resp 16   Ht 5' 6 (1.676 m)   Wt 91.2 kg   LMP 06/30/2011   SpO2 95%   BMI 32.44 kg/m  Physical Exam Vitals and nursing note reviewed.  Constitutional:      General: She is not in acute distress.    Appearance: She is well-developed.  HENT:     Head: Normocephalic and atraumatic.  Eyes:     Conjunctiva/sclera: Conjunctivae normal.  Cardiovascular:     Rate and Rhythm: Normal rate and regular rhythm.     Heart sounds: No murmur heard. Pulmonary:     Effort: Pulmonary effort is normal. No respiratory distress.     Breath sounds: Normal breath sounds.  Abdominal:     Palpations: Abdomen is soft.     Tenderness: There is no abdominal tenderness.  Musculoskeletal:        General: No swelling.     Cervical back: Neck supple.  Skin:    General: Skin is warm and dry.     Capillary Refill: Capillary refill takes less than 2 seconds.  Neurological:     Mental Status: She is alert.  Psychiatric:        Mood and Affect: Mood normal.     ED Course/ Medical Decision Making/ A&P    Procedures Procedures   Medications Ordered in ED Medications  lactated ringers  bolus 1,000 mL (1,000 mLs Intravenous New Bag/Given 10/23/24 0708)  dicyclomine  (BENTYL ) capsule 10 mg (10 mg Oral Given 10/23/24 0755)    Medical Decision Making:   [name] is a 69 y.o. female who presents for lack of satisfactory bowel movements as per above.  Physical exam is pertinent for no focal abnormalities.   The differential includes but is not limited to orthostasis, vasovagal syncope, vasovagal syncope, constipation,  medication side effects, hypothyroidism, hyperthyroidism, bowel obstruction.  Independent historian: None  External data reviewed: Labs: reviewed prior labs for baseline  Initial Plan:  Screening labs including CBC and Metabolic panel to evaluate for infectious or metabolic etiology of disease.  Screening TSH and free T4 to evaluate thyroid  Screening lipase for pancreatitis in the setting of abdominal symptoms Acute abdominal x-rays given patient reports constipation EKG to evaluate for cardiac pathology Objective evaluation as below reviewed   Labs: Ordered, Independent interpretation, and Details: Lipase WNL.  CMP without AKI, emergent electro gentian, emergent LFT abnormality.  CBC without leukocytosis, anemia, thrombocytopenia. TSH mildly elevated at 10, free T4 WNL.  Radiology: Ordered, Independent interpretation, Details: Personally viewed x-ray, do not appreciate obstructive bowel gas pattern, free fluid,  stool burden, focal airspace opacification, cardiomediastinal switcher Intran, pneumothorax, pleural effusion, bony derangement., and All images reviewed independently.  Agree with radiology report at this time.   DG ABD ACUTE 2+V W 1V CHEST Result Date: 10/23/2024 CLINICAL DATA:  Abdominal pain scratch fat abdominal discomfort and cramping. EXAM: DG ABDOMEN ACUTE WITH 1 VIEW CHEST COMPARISON:  Chest x-ray 08/20/2024 FINDINGS: The lungs are clear without focal pneumonia, edema, pneumothorax or pleural effusion. Cardiopericardial silhouette is at upper limits of normal for size. No acute bony abnormality. Telemetry leads overlie the chest. Upright film shows no evidence for intraperitoneal free air. There is no evidence for gaseous bowel dilation to suggest obstruction. Visualized bony anatomy unremarkable. IMPRESSION: 1. No acute cardiopulmonary findings. 2. Nonobstructive bowel gas pattern. Electronically Signed   By: Camellia Candle M.D.   On: 10/23/2024 08:44    EKG/Medicine tests:  Ordered and Independent interpretation EKG Interpretation:                  Interventions: LR bolus, Bentyl   See the EMR for full details regarding lab and imaging results.  With regard to near syncopal episode, given patient reports that she has had similar prior episodes in her younger years, and occurred acutely while having IV placed, most consistent with vasovagal syncope.  EKG is reassuring, doubt arrhythmia, ACS, metabolic panel reassuring, glucose WNL, doubt hypoglycemia, metabolic derangement.  Additionally with regard to both abdominal symptoms as well as syncope, patient without fever, hemodynamically stable, no leukocytosis, thus doubt severe infectious or inflammatory etiology of symptoms.  Discussed with patient that given she had no stools with initiation of levothyroxine this can be a side effect of levothyroxine, and is possible that she has stool available in her bowels since she had increased frequency of loose stool at the initiation of this medication.  Patient's abdomen is soft, nontender, which in combination with patient's reassuring labs, do not feel that patient warrants CT of the abdomen pelvis, acute abdominal series demonstrates overall small stool burden, and no other acute intra-abdominal process, feel that patient is likely having small stools because she has an overall small stool burden, discussed that labs and imaging are reassuring, as is her abdominal exam, thus she can continue to do MiraLAX daily.  Provided instructions for MiraLAX cleanout if she would like to fully evacuate her colon to reset any potential lingering constipation she has, will give referral to follow-up with gastroenterology, patient amenable this plan, discharged in stable condition.  Presentation is most consistent with acute complicated illness, Current presentation is complicated by underlying chronic conditions, and I did consider and rule out acute life/limb-threatening illness  Discussion  of management or test interpretations with external provider(s): Not indicated  Risk Drugs:Prescription drug management  Disposition: DISCHARGE: I believe that the patient is safe for discharge home with outpatient follow-up. Patient was informed of all pertinent physical exam, laboratory, and imaging findings. Patient's suspected etiology of their symptom presentation was discussed with the patient and all questions were answered. We discussed following up with PCP and GI. I provided thorough ED return precautions. The patient feels safe and comfortable with this plan.  MDM generated using voice dictation software and may contain dictation errors.  Please contact me for any clarification or with any questions.  Clinical Impression:  1. Change in bowel movement      Discharge   Final Clinical Impression(s) / ED Diagnoses Final diagnoses:  Change in bowel movement    Rx / DC Orders ED Discharge Orders  Ordered    Ambulatory referral to Gastroenterology        10/23/24 0740             Rogelia Jerilynn RAMAN, MD 10/23/24 1143  "

## 2024-10-27 ENCOUNTER — Ambulatory Visit: Admitting: Gastroenterology
